# Patient Record
Sex: Female | Born: 2000
Health system: Southern US, Community
[De-identification: ages and names within clinical notes are randomized; demographics above are authoritative.]

## PROBLEM LIST (undated history)

## (undated) DIAGNOSIS — F329 Major depressive disorder, single episode, unspecified: Secondary | ICD-10-CM

## (undated) DIAGNOSIS — R079 Chest pain, unspecified: Secondary | ICD-10-CM

## (undated) DIAGNOSIS — Z91018 Allergy to other foods: Secondary | ICD-10-CM

## (undated) DIAGNOSIS — T7840XA Allergy, unspecified, initial encounter: Secondary | ICD-10-CM

## (undated) DIAGNOSIS — F32A Depression, unspecified: Secondary | ICD-10-CM

## (undated) DIAGNOSIS — F419 Anxiety disorder, unspecified: Secondary | ICD-10-CM

## (undated) DIAGNOSIS — L509 Urticaria, unspecified: Secondary | ICD-10-CM

## (undated) DIAGNOSIS — R609 Edema, unspecified: Secondary | ICD-10-CM

## (undated) DIAGNOSIS — L501 Idiopathic urticaria: Secondary | ICD-10-CM

## (undated) HISTORY — DX: Edema, unspecified: R60.9

## (undated) HISTORY — DX: Depression, unspecified: F32.A

## (undated) HISTORY — DX: Chest pain, unspecified: R07.9

## (undated) HISTORY — DX: Anxiety disorder, unspecified: F41.9

## (undated) HISTORY — PX: TYMPANOSTOMY TUBE PLACEMENT: SHX32

## (undated) HISTORY — DX: Allergy to other foods: Z91.018

## (undated) HISTORY — DX: Idiopathic urticaria: L50.1

## (undated) HISTORY — DX: Major depressive disorder, single episode, unspecified: F32.9

## (undated) HISTORY — PX: ADENOIDECTOMY, TONSILLECTOMY AND MYRINGOTOMY WITH TUBE PLACEMENT: SHX5716

---
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
## 2019-02-14 ENCOUNTER — Ambulatory Visit: Payer: 59 | Attending: Internal Medicine

## 2019-02-14 DIAGNOSIS — Z20822 Contact with and (suspected) exposure to covid-19: Secondary | ICD-10-CM

## 2019-02-16 LAB — NOVEL CORONAVIRUS, NAA: SARS-CoV-2, NAA: NOT DETECTED

## 2019-02-24 ENCOUNTER — Ambulatory Visit: Payer: 59 | Attending: Internal Medicine

## 2019-02-24 DIAGNOSIS — Z20822 Contact with and (suspected) exposure to covid-19: Secondary | ICD-10-CM

## 2019-02-26 LAB — NOVEL CORONAVIRUS, NAA

## 2019-03-17 ENCOUNTER — Other Ambulatory Visit: Payer: 59

## 2019-03-21 ENCOUNTER — Ambulatory Visit: Payer: 59 | Attending: Internal Medicine

## 2019-03-21 DIAGNOSIS — Z20822 Contact with and (suspected) exposure to covid-19: Secondary | ICD-10-CM

## 2019-03-22 LAB — NOVEL CORONAVIRUS, NAA: SARS-CoV-2, NAA: NOT DETECTED

## 2019-05-01 ENCOUNTER — Telehealth: Payer: 59 | Admitting: Family

## 2019-05-01 DIAGNOSIS — J301 Allergic rhinitis due to pollen: Secondary | ICD-10-CM | POA: Diagnosis not present

## 2019-05-01 MED ORDER — LEVOCETIRIZINE DIHYDROCHLORIDE 5 MG PO TABS: 5.0000 mg | ORAL_TABLET | Freq: Every evening | ORAL | 1 refills | Status: DC

## 2019-05-01 MED ORDER — FLUTICASONE PROPIONATE 50 MCG/ACT NA SUSP
2.0000 | Freq: Every day | NASAL | 6 refills | Status: AC
Start: 2019-05-01 — End: ?

## 2019-05-01 NOTE — Progress Notes (Signed)
E visit for Allergic Rhinitis We are sorry that you are not feeling well.  Here is how we plan to help!  Based on what you have shared with me it looks like you have Allergic Rhinitis.  Rhinitis is when a reaction occurs that causes nasal congestion, runny nose, sneezing, and itching.  Most types of rhinitis are caused by an inflammation and are associated with symptoms in the eyes ears or throat. There are several types of rhinitis.  The most common are acute rhinitis, which is usually caused by a viral illness, allergic or seasonal rhinitis, and nonallergic or year-round rhinitis.  Nasal allergies occur certain times of the year.  Allergic rhinitis is caused when allergens in the air trigger the release of histamine in the body.  Histamine causes itching, swelling, and fluid to build up in the fragile linings of the nasal passages, sinuses and eyelids.  An itchy nose and clear discharge are common.  I recommend the following over the counter treatments: You should take a daily dose of antihistamine and Xyzal 5 mg take 1 tablet daily  I also would recommend a nasal spray: Flonase 2 sprays into each nostril once daily   HOME CARE:  You can use an over-the-counter saline nasal spray as needed Avoid areas where there is heavy dust, mites, or molds Stay indoors on windy days during the pollen season Keep windows closed in home, at least in bedroom; use air conditioner. Use high-efficiency house air filter Keep windows closed in car, turn AC on re-circulate Avoid playing out with dog during pollen season  GET HELP RIGHT AWAY IF:  If your symptoms do not improve within 10 days You become short of breath You develop yellow or green discharge from your nose for over 3 days You have coughing fits  MAKE SURE YOU:  Understand these instructions Will watch your condition Will get help right away if you are not doing well or get worse  Thank you for choosing an e-visit. Your e-visit answers  were reviewed by a board certified advanced clinical practitioner to complete your personal care plan. Depending upon the condition, your plan could have included both over the counter or prescription medications. Please review your pharmacy choice. Be sure that the pharmacy you have chosen is open so that you can pick up your prescription now.  If there is a problem you may message your provider in MyChart to have the prescription routed to another pharmacy. Your safety is important to us. If you have drug allergies check your prescription carefully.  For the next 24 hours, you can use MyChart to ask questions about today's visit, request a non-urgent call back, or ask for a work or school excuse from your e-visit provider. You will get an email in the next two days asking about your experience. I hope that your e-visit has been valuable and will speed your recovery.   Approximately 5 minutes was spent documenting and reviewing patient's chart.       

## 2019-05-11 ENCOUNTER — Ambulatory Visit: Payer: 59 | Attending: Internal Medicine

## 2019-05-11 DIAGNOSIS — Z23 Encounter for immunization: Secondary | ICD-10-CM

## 2019-05-11 NOTE — Progress Notes (Signed)
   Covid-19 Vaccination Clinic  Name:  Liana Camerer    MRN: [id number] DOB: March 29, 2000  05/11/2019  Ms. Bardales was observed post Covid-19 immunization for 30 minutes based on pre-vaccination screening without incident. She was provided with Vaccine Information Sheet and instruction to access the V-Safe system.   Ms. Micheletti was instructed to call 911 with any severe reactions post vaccine: Marland Kitchen Difficulty breathing  . Swelling of face and throat  . A fast heartbeat  . A bad rash all over body  . Dizziness and weakness   Immunizations Administered    Name Date Dose VIS Date Route   Pfizer COVID-19 Vaccine 05/11/2019  3:54 PM 0.3 mL 01/20/2019 Intramuscular   Manufacturer: [employer], [employer]   Lot: [id number]   NDC: [address]-2

## 2019-06-06 ENCOUNTER — Ambulatory Visit: Payer: 59 | Attending: Internal Medicine

## 2019-06-06 DIAGNOSIS — Z23 Encounter for immunization: Secondary | ICD-10-CM

## 2019-06-06 NOTE — Progress Notes (Signed)
   Covid-19 Vaccination Clinic  Name:  Maria Pena    MRN: [id number] DOB: [birth year]  06/06/2019  [name] was observed post Covid-19 immunization for 30 minutes based on pre-vaccination screening without incident. She was provided with Vaccine Information Sheet and instruction to access the V-Safe system.   [name] was instructed to call 911 with any severe reactions post vaccine: Marland Kitchen Difficulty breathing  . Swelling of face and throat  . A fast heartbeat  . A bad rash all over body  . Dizziness and weakness   Immunizations Administered    Name Date Dose VIS Date Route   Pfizer COVID-19 Vaccine 06/06/2019  8:15 AM 0.3 mL 04/05/2018 Intramuscular   Manufacturer: [employer], [employer]   Lot: [id number]   NDC: [address]-7

## 2020-05-23 ENCOUNTER — Ambulatory Visit: Payer: 59 | Admitting: Podiatry

## 2020-05-23 ENCOUNTER — Ambulatory Visit (INDEPENDENT_AMBULATORY_CARE_PROVIDER_SITE_OTHER): Payer: 59

## 2020-05-23 ENCOUNTER — Other Ambulatory Visit: Payer: Self-pay

## 2020-05-23 DIAGNOSIS — S93621S Sprain of tarsometatarsal ligament of right foot, sequela: Secondary | ICD-10-CM

## 2020-05-23 DIAGNOSIS — M7750 Other enthesopathy of unspecified foot: Secondary | ICD-10-CM

## 2020-05-23 DIAGNOSIS — M7751 Other enthesopathy of right foot: Secondary | ICD-10-CM

## 2020-05-23 DIAGNOSIS — M2041 Other hammer toe(s) (acquired), right foot: Secondary | ICD-10-CM | POA: Diagnosis not present

## 2020-05-23 DIAGNOSIS — L84 Corns and callosities: Secondary | ICD-10-CM

## 2020-05-23 DIAGNOSIS — M2042 Other hammer toe(s) (acquired), left foot: Secondary | ICD-10-CM

## 2020-05-23 DIAGNOSIS — M7671 Peroneal tendinitis, right leg: Secondary | ICD-10-CM

## 2020-05-23 NOTE — Patient Instructions (Signed)
Peroneal Tendinopathy Rehab Ask your health care provider which exercises are safe for you. Do exercises exactly as told by your health care provider and adjust them as directed. It is normal to feel mild stretching, pulling, tightness, or discomfort as you do these exercises. Stop right away if you feel sudden pain or your pain gets worse. Do not begin these exercises until told by your health care provider. Stretching and range-of-motion exercises These exercises warm up your muscles and joints and improve the movement and flexibility of your ankle. These exercises also help to relieve pain and stiffness. Gastroc and soleus stretch, standing  This is an exercise in which you stand on a step and use your body weight to stretch your calf muscles. To do this exercise: 1. Stand on the edge of a step on the ball of your left / right foot. The ball of your foot is on the walking surface, right under your toes. 2. Keep your other foot firmly on the same step. 3. Hold on to the wall, a railing, or a chair for balance. 4. Slowly lift your other foot, allowing your body weight to press your left / right heel down over the edge of the step. You should feel a stretch in your left / right calf (gastrocnemius and soleus). 5. Hold this position for 15 seconds. 6. Return both feet to the step. 7. Repeat this exercise with a slight bend in your left / right knee. Repeat 5 times with your left / right knee straight and 5 times with your left / right knee bent. Complete this exercise 2 times a day. Strengthening exercises These exercises build strength and endurance in your foot and ankle. Endurance is the ability to use your muscles for a long time, even after they get tired. Ankle dorsiflexion with band   1. Secure a rubber exercise band or tube to an object, such as a table leg, that will not move when the band is pulled. 2. Secure the other end of the band around your left / right foot. 3. Sit on the floor,  facing the object with your left / right leg extended. The band or tube should be slightly tense when your foot is relaxed. 4. Slowly flex your left / right ankle and toes to bring your foot toward you (dorsiflexion). 5. Hold this position for 15 seconds. 6. Let the band or tube slowly pull your foot back to the starting position. Repeat 5 times. Complete this exercise 2 times a day. Ankle eversion 1. Sit on the floor with your legs straight out in front of you. 2. Loop a rubber exercise band or tube around the ball of your left / right foot. The ball of your foot is on the walking surface, right under your toes. 3. Hold the ends of the band in your hands, or secure the band to a stable object. The band or tube should be slightly tense when your foot is relaxed. 4. Slowly push your foot outward, away from your other leg (eversion). 5. Hold this position for 15 seconds. 6. Slowly return your foot to the starting position. Repeat 5 times. Complete this exercise 2 times a day. Plantar flexion, standing  This exercise is sometimes called standing heel raise. 1. Stand with your feet shoulder-width apart. 2. Place your hands on a wall or table to steady yourself as needed, but try not to use it for support. 3. Keep your weight spread evenly over the width of your feet   while you slowly rise up on your toes (plantar flexion). If told by your health care provider: ? Shift your weight toward your left / right leg until you feel challenged. ? Stand on your left / right leg only. 4. Hold this position for 15 seconds. Repeat 2 times. Complete this exercise 2 times a day. Single leg stand 1. Without shoes, stand near a railing or in a doorway. You may hold on to the railing or door frame as needed. 2. Stand on your left / right foot. Keep your big toe down on the floor and try to keep your arch lifted. ? Do not roll to the outside of your foot. ? If this exercise is too easy, you can try it with your  eyes closed or while standing on a pillow. 3. Hold this position for 15 seconds. Repeat 5 times. Complete this exercise 2 times a day. This information is not intended to replace advice given to you by your health care provider. Make sure you discuss any questions you have with your health care provider. Document Revised: 05/17/2018 Document Reviewed: 05/17/2018 Elsevier Patient Education  2020 [employer]  

## 2020-05-28 ENCOUNTER — Encounter: Payer: Self-pay | Admitting: Podiatry

## 2020-05-28 NOTE — Progress Notes (Signed)
  Subjective:  Patient ID: Maria Pena, female    DOB: [birth year],  MRN: [id number]  Chief Complaint  Patient presents with  . Callouses      (np) preulcerative corn/callus bilateral    20 y.o. female presents with the above complaint. History confirmed with patient.  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  She has these on both fourth toes on the outside rubbing on the fifth toe.  She wears vans shoes a lot and seems to make it worse.  She has lateral right foot pain as well she has a history of fracture in 2020 and twisted that ankle last October.  Objective:  Physical Exam: warm, good capillary refill, no trophic changes or ulcerative lesions, normal DP and PT pulses and normal sensory exam.  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  Mild right foot pain along the peroneus brevis at its insertion without gross instability or ecchymosis or evidence of fracture.  She also has mild pain along the tarsometatarsal joints with stress abduction and palpation the Lisfranc interval  Radiographs: X-ray of the right foot: no fracture, dislocation, swelling or degenerative changes noted Assessment:   1. Peroneal tendinitis of right lower extremity   2. Callus of foot   3. Hammertoe of right foot   4. Hammertoe of left foot   5. Sprain of ligament of tarsometatarsal joint of right foot, sequela      Plan:  Patient was evaluated and treated and all questions answered.  Reviewed etiology and treatment options for heloma molle and the reason that this forms with the adductovarus rotation of the toes.  Recommended shaving the lesions down today and offloading with silicone pads.  If not helpful could consider arthroplasty to alleviate this, she will consider this for summer break.  Also discussed with her that she likely has some peroneal tendinitis from her previous right foot and ankle injury.  There is no gross  instability or evidence of new fracture today on her x-rays or clinically.  She did have some pain in the tarsometatarsal joint as well.  If not improving may want to consider MRI to evaluate for subtle Lisfranc injury.  Gave her stretching and icing exercises for peroneal tendinopathy.  OTC NSAIDs as needed, reviewed RICE protocol.  Return in about 6 weeks (around 07/04/2020) for re-check peroneal tendinitis.

## 2020-07-09 ENCOUNTER — Ambulatory Visit: Payer: 59 | Admitting: Podiatry

## 2020-07-09 ENCOUNTER — Encounter: Payer: Self-pay | Admitting: Podiatry

## 2020-07-09 ENCOUNTER — Other Ambulatory Visit: Payer: Self-pay

## 2020-07-09 DIAGNOSIS — S93621S Sprain of tarsometatarsal ligament of right foot, sequela: Secondary | ICD-10-CM

## 2020-07-09 DIAGNOSIS — M7671 Peroneal tendinitis, right leg: Secondary | ICD-10-CM

## 2020-07-09 NOTE — Progress Notes (Signed)
  Subjective:  Patient ID: Maria Pena, female    DOB: February 11, 2000,  MRN: [id number]  Chief Complaint  Patient presents with  . Foot Pain    Patient reports right foot is about the same as it was last visit. Some days she reports extreme pain and other days no discomfort at all. Patient stated that she tried the stretching and icing as recommended but it has not changed the discomfort in the right foot.   Melene Plan    Patient stated that she has been wearing open-toe shoes and she has not had any corn/callous develop. When wearing closed toe shoes she wears a corn pad for extra support.     20 y.o. female returns for follow-up with the above complaint. History confirmed with patient.  Midfoot and ankle pain is still persistent.  She has had improvement in the calluses that do not bother her anymore the toes  Objective:  Physical Exam: warm, good capillary refill, no trophic changes or ulcerative lesions, normal DP and PT pulses and normal sensory exam. Mild right foot pain along the peroneus brevis at its insertion without gross instability or ecchymosis or evidence of fracture.  She also has mild pain along the tarsometatarsal joints with stress abduction and palpation the Lisfranc interval  Radiographs: X-ray of the right foot: no fracture, dislocation, swelling or degenerative changes noted Assessment:   1. Peroneal tendinitis of right lower extremity   2. Sprain of ligament of tarsometatarsal joint of right foot, sequela      Plan:  Patient was evaluated and treated and all questions answered.  Reviewed further treatment of the peroneal tendinitis as well as the previous likely Lisfranc injury.  This point she has not had improvement with nonsurgical treatment.  Recommend we evaluate with an MRI to discuss possible surgical treatment or further treatment nonsurgically.  MRI of the ankle for the peroneal tendons as well as the MRI of the foot for the Lisfranc complex was ordered.   Return in 4 weeks after completion for follow-up  Return in about 4 weeks (around 08/06/2020) for after MRI to review.

## 2020-07-24 ENCOUNTER — Ambulatory Visit: Payer: 59 | Admitting: Behavioral Health

## 2020-07-30 ENCOUNTER — Ambulatory Visit
Admission: RE | Admit: 2020-07-30 | Discharge: 2020-07-30 | Disposition: A | Discharge status: Home / Self Care | Payer: 59 | Source: Ambulatory Visit | Attending: Podiatry | Admitting: Podiatry

## 2020-07-30 ENCOUNTER — Other Ambulatory Visit: Payer: Self-pay

## 2020-07-30 DIAGNOSIS — S93621S Sprain of tarsometatarsal ligament of right foot, sequela: Secondary | ICD-10-CM

## 2020-07-30 DIAGNOSIS — M7671 Peroneal tendinitis, right leg: Secondary | ICD-10-CM

## 2020-07-30 IMAGING — MR MR ANKLE*R* W/O CM
5 series · 38 of 40 positions shown · non-contrast
Comparison: Right foot radiograph [DATE]

CLINICAL DATA: Ankle pain, tendon abnormality suspected, neg xray

EXAM:
MRI OF THE RIGHT ANKLE WITHOUT CONTRAST
TECHNIQUE: Multiplanar, multisequence MR imaging of the ankle was performed. No
intravenous contrast was administered.

[Series 4: T2 fat-sat · axial · 3.0mm · 0.50mm/px · z∈[-106,+15]mm · 9 of 32 slices shown (1 of 2)]
[im 1/32]
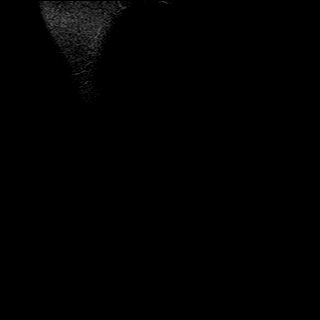
[im 4/32]
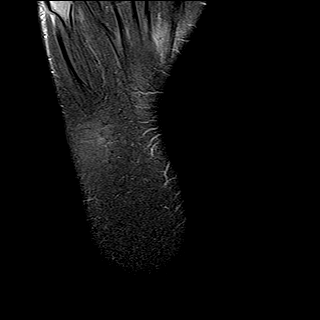
[im 8/32]
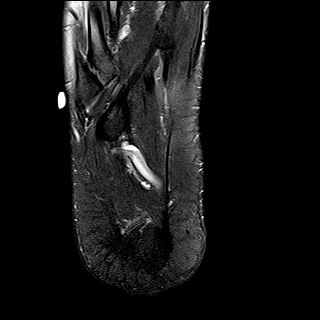
[im 12/32]
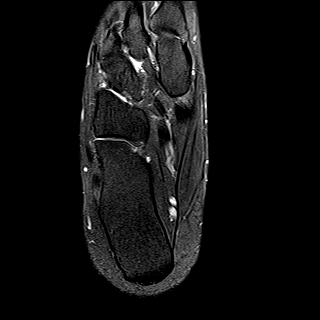
[im 16/32]
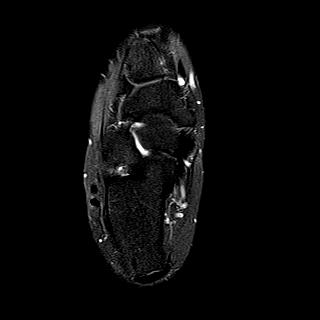
[im 20/32]
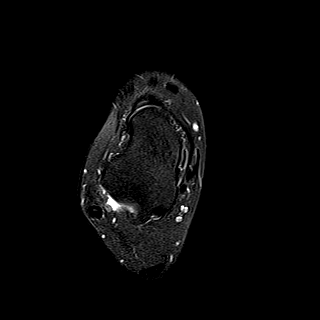
[im 24/32]
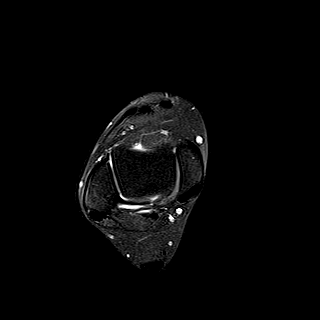
[im 28/32]
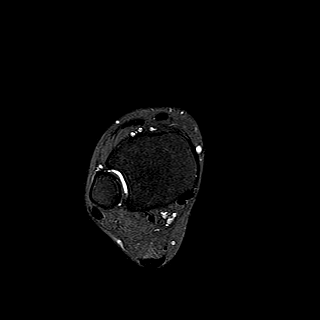
[im 32/32]
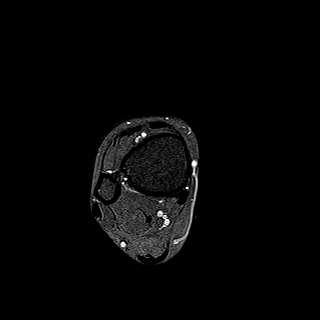

[Series 5: PD fat-sat · axial · 3.0mm · 0.50mm/px · z∈[-106,+15]mm · 9 of 32 slices shown]
[im 1/32]
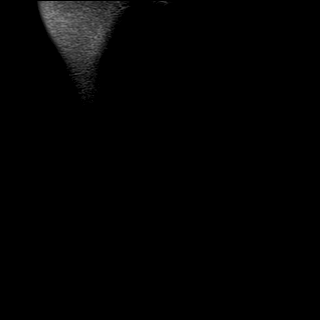
[im 4/32]
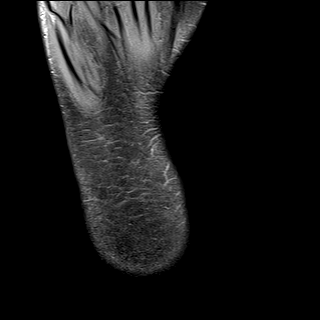
[im 8/32]
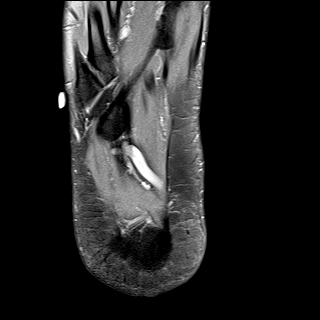
[im 12/32]
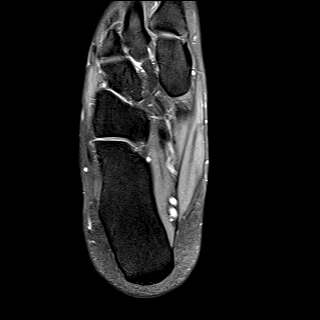
[im 16/32]
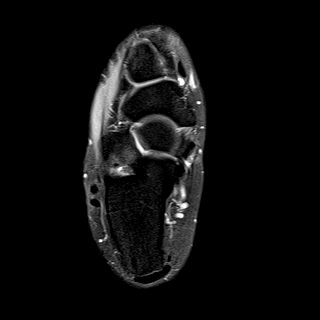
[im 20/32]
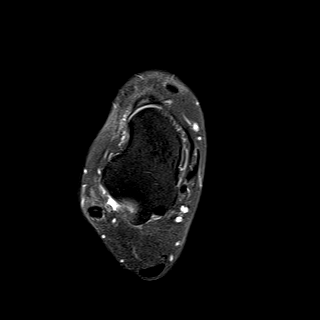
[im 24/32]
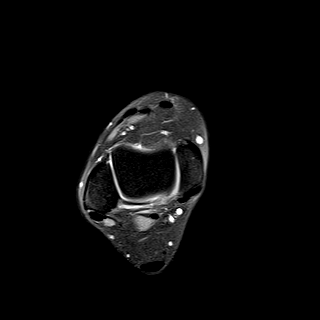
[im 28/32]
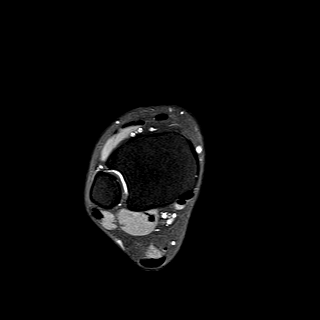
[im 32/32]
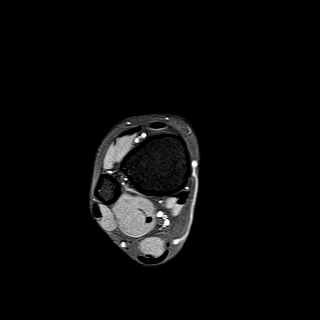

[Series 6: T1 · sagittal · 4.0mm · 0.56mm/px · 6 of 22 slices shown]
[im 1/22]
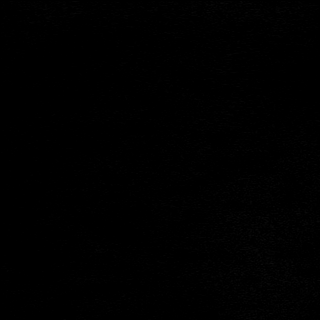
[im 5/22]
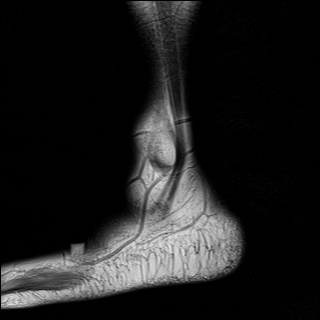
[im 9/22]
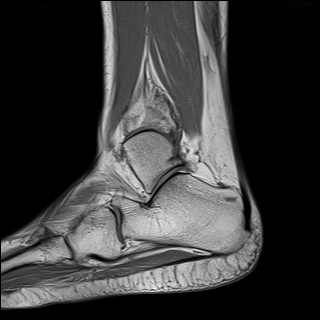
[im 13/22]
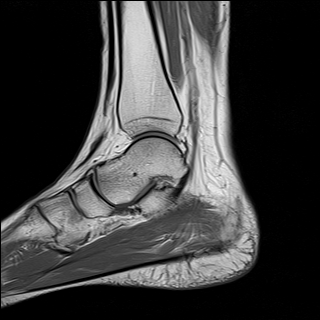
[im 17/22]
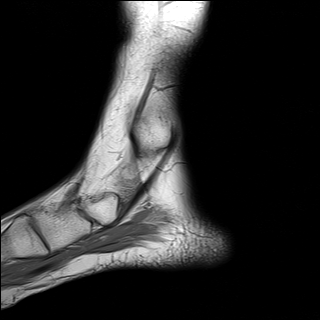
[im 22/22]
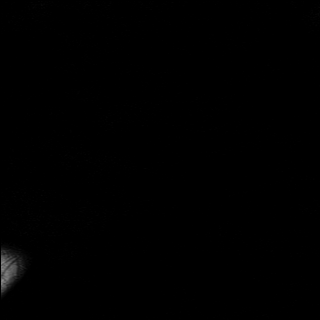

[Series 7: STIR · sagittal · 4.0mm · 0.35mm/px · 6 of 22 slices shown]
[im 1/22]
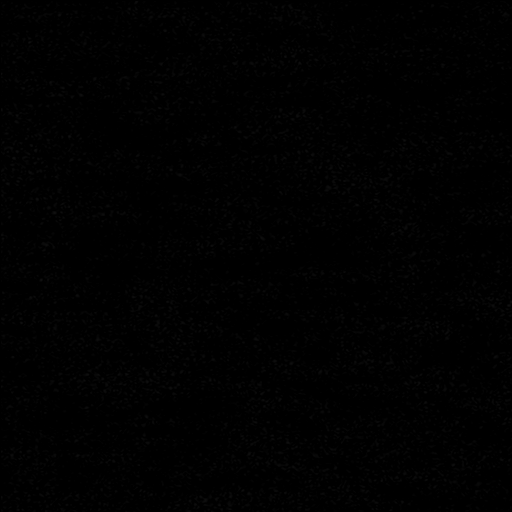
[im 5/22]
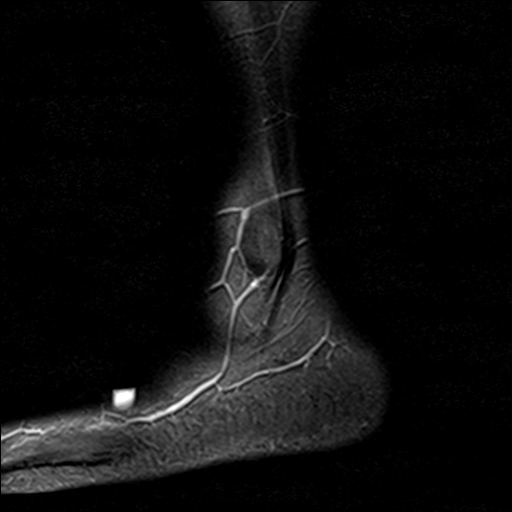
[im 9/22]
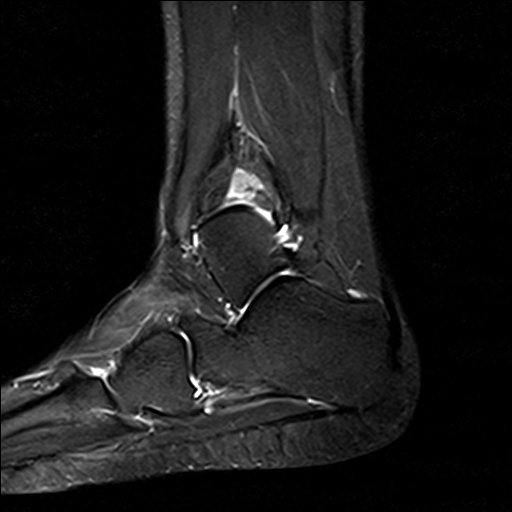
[im 13/22]
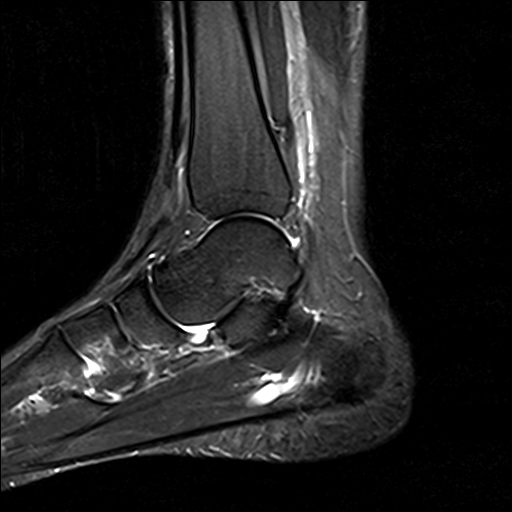
[im 17/22]
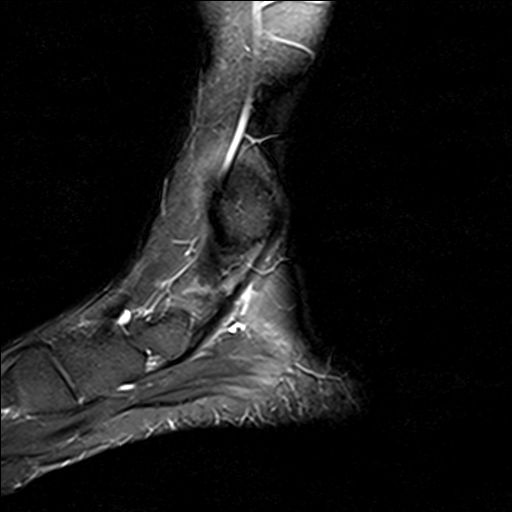
[im 22/22]
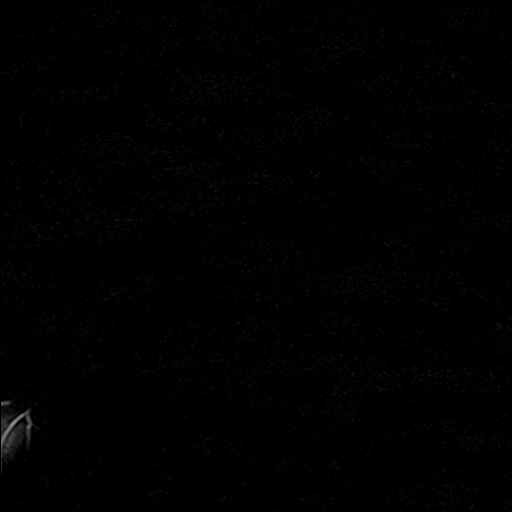

[Series 8: T2 fat-sat · coronal · 3.0mm · 0.50mm/px · 8 of 38 slices shown (2 of 2)]
[im 1/38]
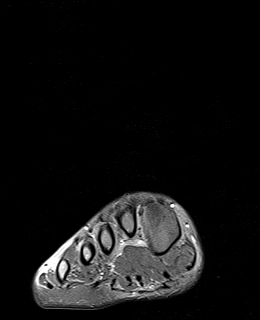
[im 5/38]
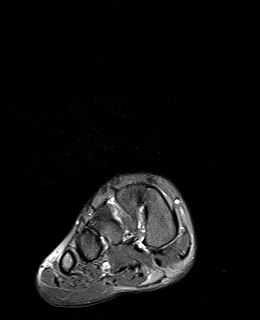
[im 13/38]
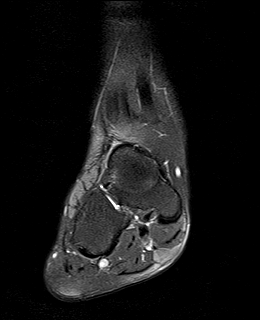
[im 17/38]
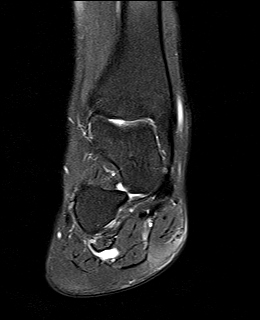
[im 21/38]
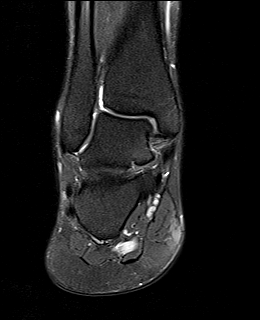
[im 25/38]
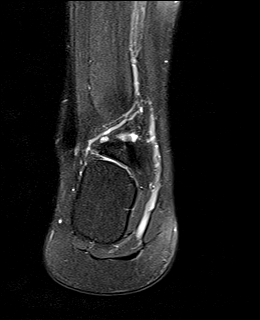
[im 33/38]
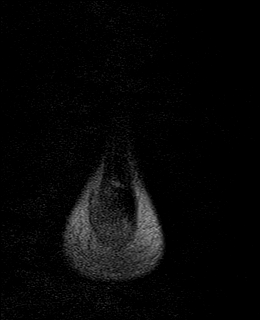
[im 38/38]
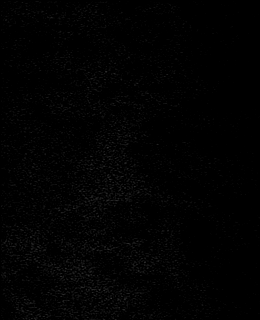

[38 of 40 positions shown; findings below may reference images not displayed]

FINDINGS: TENDONS

Peroneal: Peroneal longus tendon intact. Peroneal brevis intact.

Posteromedial: Posterior tibial tendon intact. Flexor digitorum
longus tendon intact. Flexor hallucis longus tendon intact.

Anterior: Tibialis anterior tendon intact. Extensor hallucis longus
tendon intact Extensor digitorum longus tendon intact.

Achilles:  Intact.

Plantar Fascia: Intact.

LIGAMENTS

Lateral: Anterior talofibular ligament intact. Calcaneofibular
ligament intact. Posterior talofibular ligament intact. Anterior and
posterior tibiofibular ligaments intact.

Medial: Deltoid ligament intact. Spring ligament intact.

CARTILAGE

Ankle Joint: No joint effusion. Normal ankle mortise. No chondral
defect.

Subtalar Joints/Sinus Tarsi: Normal subtalar joints. No subtalar
joint effusion. Normal sinus tarsi.

Bones: No marrow signal abnormality.  No fracture or dislocation.

Soft Tissue: No fluid collection or hematoma. Muscles are normal
without edema or atrophy. Tarsal tunnel is normal.
IMPRESSION: Unremarkable right ankle MRI. No acute findings to explain the
patient's right ankle pain.

## 2020-07-30 IMAGING — MR MR FOOT*R* W/O CM
4 of 5 series · 19 of 40 positions shown · non-contrast
Comparison: Right foot radiograph [DATE]

CLINICAL DATA: Foot trauma, Lisfranc suspected, xray done (Age >=
6y)

EXAM:
MRI OF THE RIGHT FOREFOOT WITHOUT CONTRAST
TECHNIQUE: Multiplanar, multivalence MR imaging of the right foot was
performed. No intravenous contrast was administered.

[Series 3: T1 · coronal · 3.0mm · 0.19mm/px · 3 of 44 slices shown (1 of 2)]
[im 8/44]
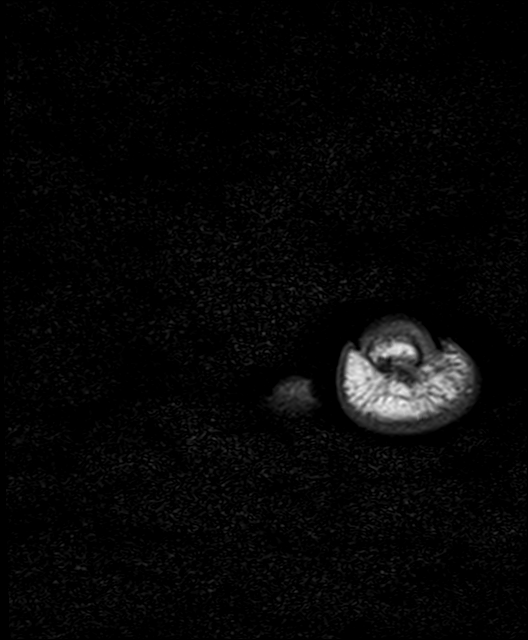
[im 24/44]
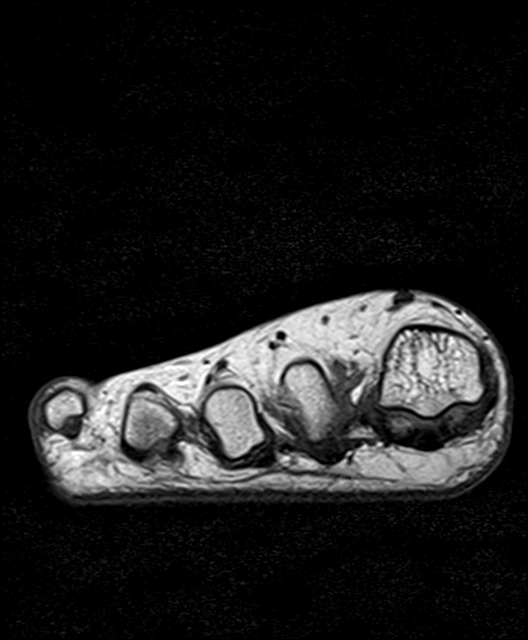
[im 40/44]
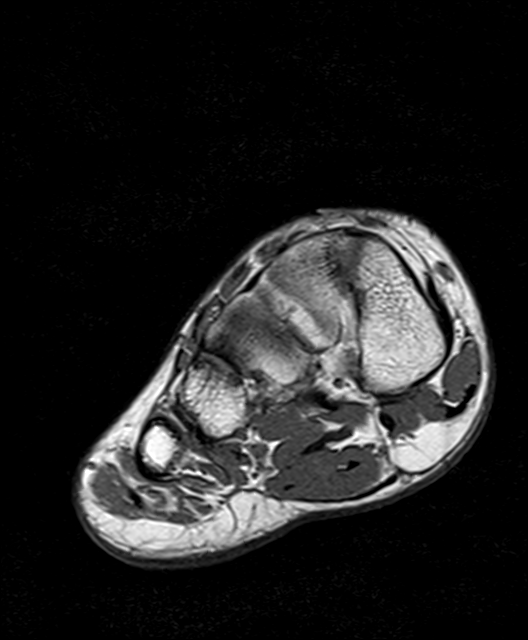

[Series 5: T2 fat-sat · axial · 3.0mm · 0.35mm/px · z∈[-158,-78]mm · 5 of 22 slices shown (1 of 2)]
[im 1/22]
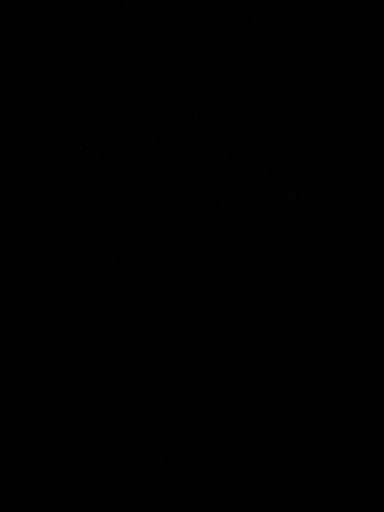
[im 6/22]
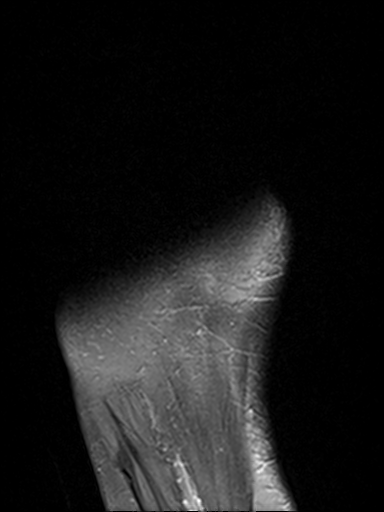
[im 11/22]
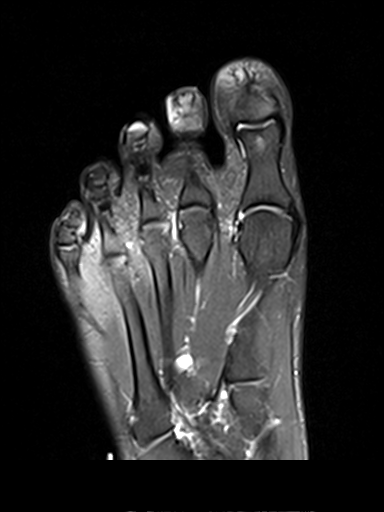
[im 16/22]
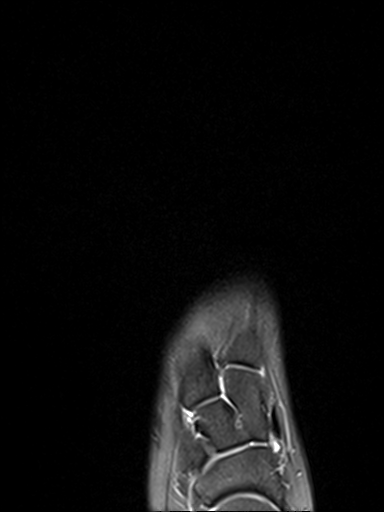
[im 22/22]
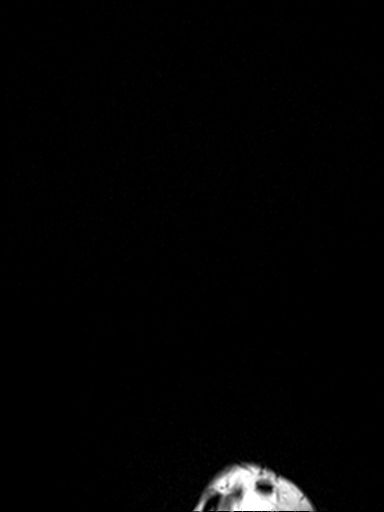

[Series 6: T1 · axial · 3.0mm · 0.35mm/px · z∈[-158,-78]mm · 3 of 22 slices shown (2 of 2)]
[im 1/22]
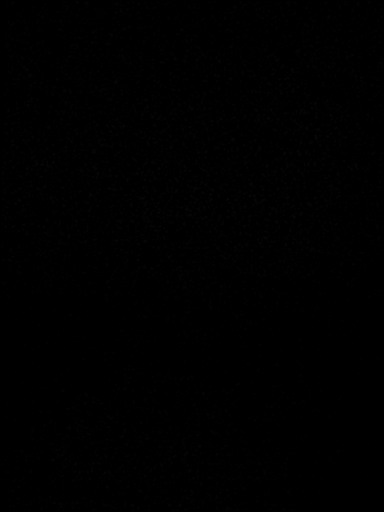
[im 11/22]
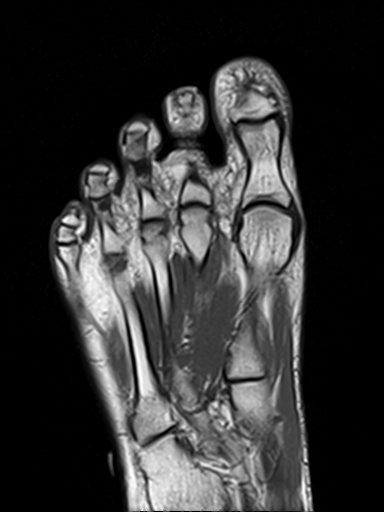
[im 22/22]
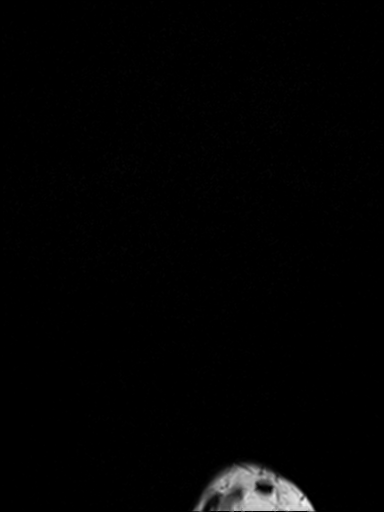

[Series 8: T2 fat-sat · coronal · 3.0mm · 0.19mm/px · 8 of 44 slices shown (2 of 2)]
[im 1/44]
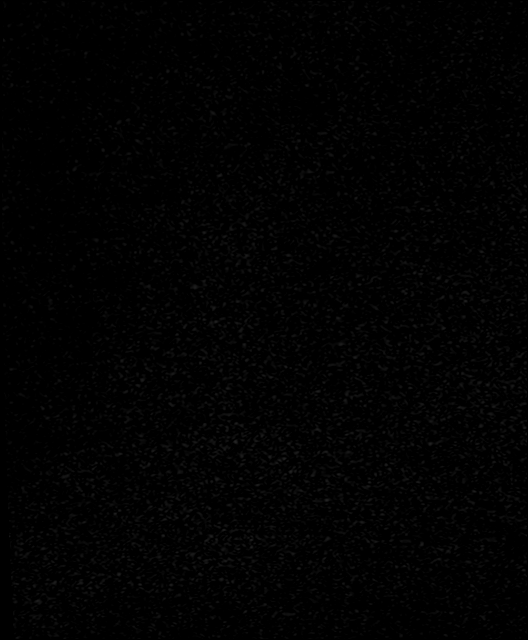
[im 5/44]
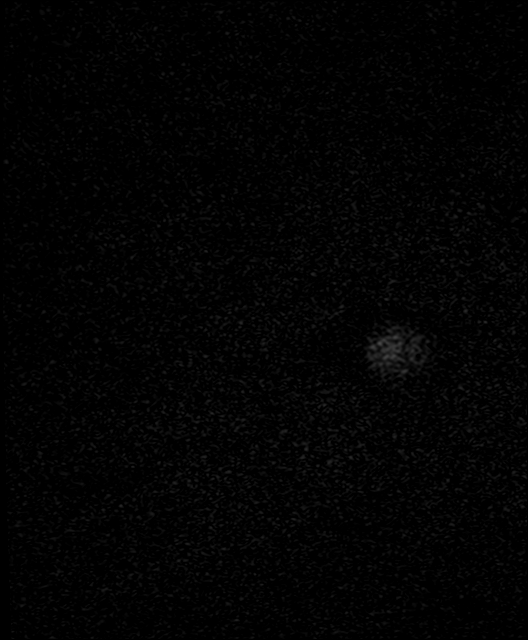
[im 9/44]
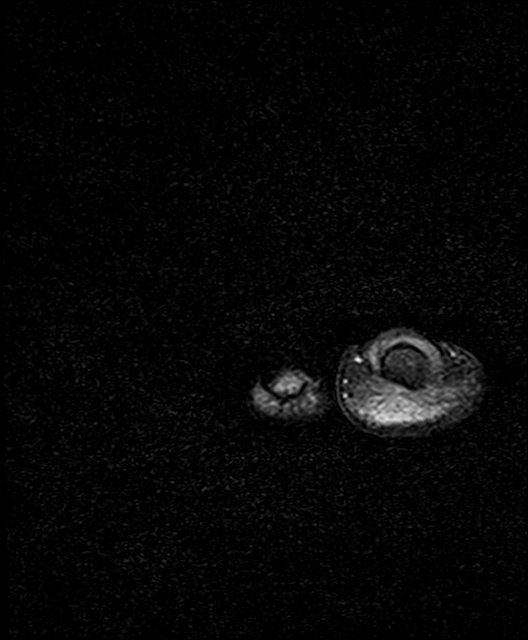
[im 13/44]
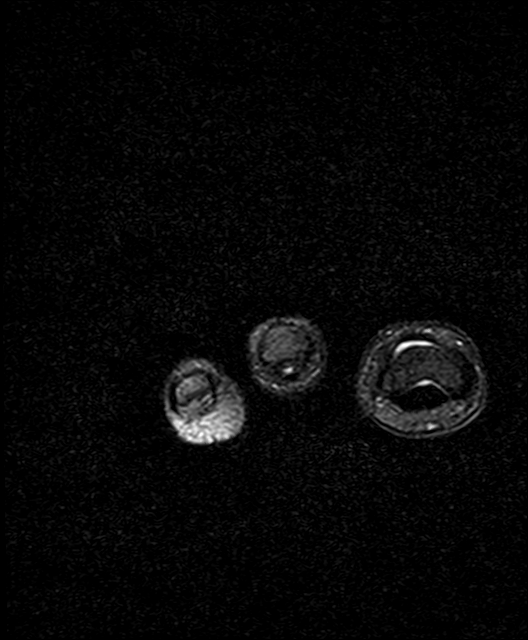
[im 18/44]
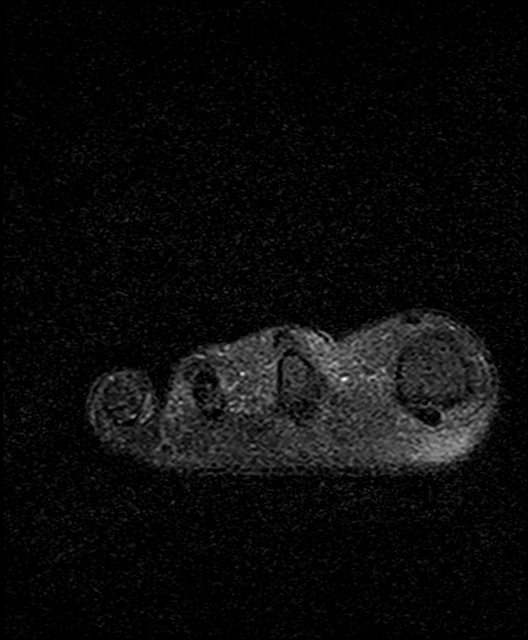
[im 22/44]
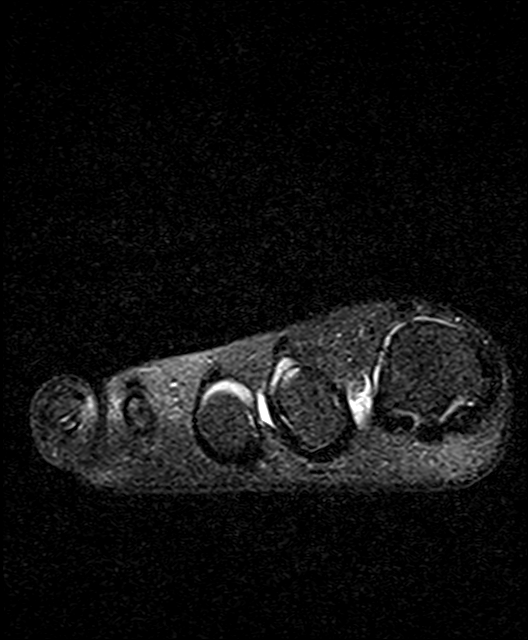
[im 26/44]
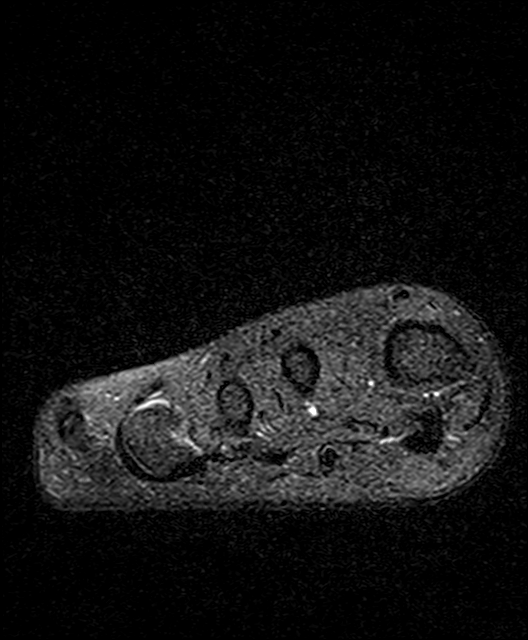
[im 39/44]
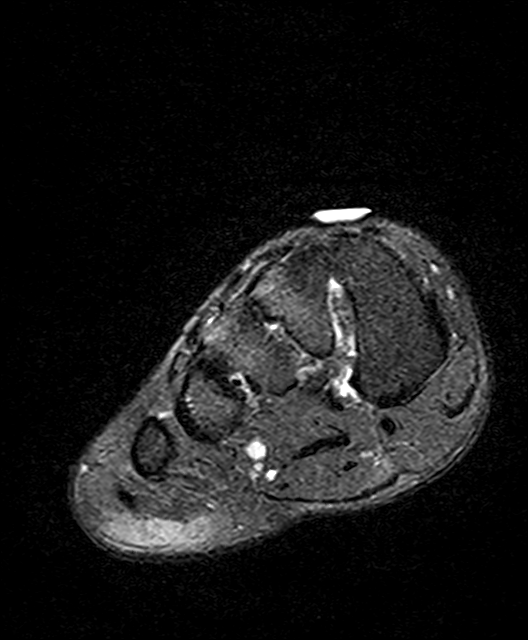

[19 of 40 positions shown; findings below may reference images not displayed]

FINDINGS: Bones/Joint/Cartilage

There is no evidence of fracture. The cortex is intact. There is no
significant marrow signal alteration.

Ligaments

Intact Lisfranc ligament.  No evidence of plantar plate tear

Muscles and Tendons

No significant muscle atrophy. No acute tendon tear in the forefoot.

Soft tissues

No evidence of soft tissue mass. There is no evidence of
intermetatarsal neuroma or bursitis.
IMPRESSION: Unremarkable right foot MRI. Intact Lisfranc ligament. No acute
findings to explain the patient's foot pain.

## 2020-08-05 ENCOUNTER — Other Ambulatory Visit: Payer: Self-pay

## 2020-08-05 ENCOUNTER — Ambulatory Visit: Payer: 59 | Admitting: Behavioral Health

## 2020-08-05 ENCOUNTER — Encounter: Payer: Self-pay | Admitting: Behavioral Health

## 2020-08-05 VITALS — BP 119/77 | HR 81 | Ht 65.0 in | Wt 221.0 lb

## 2020-08-05 DIAGNOSIS — F411 Generalized anxiety disorder: Secondary | ICD-10-CM

## 2020-08-05 DIAGNOSIS — F900 Attention-deficit hyperactivity disorder, predominantly inattentive type: Secondary | ICD-10-CM | POA: Diagnosis not present

## 2020-08-05 DIAGNOSIS — F3341 Major depressive disorder, recurrent, in partial remission: Secondary | ICD-10-CM | POA: Diagnosis not present

## 2020-08-05 MED ORDER — AMPHETAMINE-DEXTROAMPHET ER 10 MG PO CP24: 10.0000 mg | ORAL_CAPSULE | Freq: Every day | ORAL | 0 refills | Status: DC

## 2020-08-05 NOTE — Progress Notes (Signed)
[facility] MD/PA/NP Initial Note  08/05/2020 12:09 PM Maria Pena  MRN:  [id number]  Chief Complaint:  Chief Complaint   ADHD; Anxiety; Depression; Establish Care; Patient Education     HPI:  20 year old patient presents to this office for initial visit and to establish care. She says that she is here today because of her issues with concentration and attention problems that effect her productivity in school. She says that she is currently a Consulting civil engineer at [employer] and has always had problems with focus and attention such as reading assignments or books. Says she gets easily distracted before being able to complete one task before moving to another. Says this is a great source of her anxiety. She has struggled with depression and anxiety for several years but says her father did not think she should be evaluated for ADHD. She said she finally got to the place as an adult that she wanted to finally seek treatment. She reports increased irritability during the school year, racing thoughts, and distractibility. Reports depression a 3/10 today and anxiety at 6/10. Sleeps 7-8 hours per night. No mania, no psychosis. No SI/HI.  No prior psychiatric medication trials   Visit Diagnosis:    ICD-10-CM   1. Attention deficit hyperactivity disorder (ADHD), predominantly inattentive type  F90.0 amphetamine-dextroamphetamine (ADDERALL XR) 10 MG 24 hr capsule    2. Generalized anxiety disorder  F41.1 amphetamine-dextroamphetamine (ADDERALL XR) 10 MG 24 hr capsule    3. Recurrent major depressive disorder, in partial remission (HCC)  F33.41 amphetamine-dextroamphetamine (ADDERALL XR) 10 MG 24 hr capsule      Past Psychiatric History: Aliene Beams PCP  Past Medical History:  Past Medical History:  Diagnosis Date   Allergic    Anxiety    Chest pain    Chronic idiopathic urticaria    Depression    Edema    feet and legs   Hives    unknown origin   Multiple food allergies     soy, soy sauce, shrimp    Past Surgical History:  Procedure Laterality Date   ADENOIDECTOMY, TONSILLECTOMY AND MYRINGOTOMY WITH TUBE PLACEMENT     TYMPANOSTOMY TUBE PLACEMENT      Family Psychiatric History: none  Family History:  Family History  Problem Relation Age of Onset   Cancer Mother    Depression Mother    Anxiety disorder Mother    Sleep apnea Mother    Eating disorder Mother    Obesity Mother    Sleep apnea Father    Obesity Father     Social History:  Social History   Socioeconomic History   Marital status: Single    Spouse name: Not on file   Number of children: Not on file   Years of education: Not on file   Highest education level: Not on file  Occupational History   Occupation: student  Tobacco Use   Smoking status: Never   Smokeless tobacco: Never  Substance and Sexual Activity   Alcohol use: No   Drug use: No   Sexual activity: Not on file  Other Topics Concern   Not on file  Social History Narrative   Not on file   Social Determinants of Health   Financial Resource Strain: Not on file  Food Insecurity: Not on file  Transportation Needs: Not on file  Physical Activity: Not on file  Stress: Not on file  Social Connections: Not on file    Allergies:  Allergies  Allergen Reactions  Colchicine Other (See Comments)    Hands becomes numb from the elbows down.   Nickel    Other     Topical steroids, tea tree oil   Soy Allergy     Metabolic Disorder Labs: Lab Results  Component Value Date   HGBA1C 5.0 08/06/2016   No results found for: PROLACTIN Lab Results  Component Value Date   CHOL 135 08/06/2016   TRIG 104 (H) 08/06/2016   HDL 45 08/06/2016   LDLCALC 69 08/06/2016   LDLCALC 92 04/01/2016   Lab Results  Component Value Date   TSH 2.120 04/01/2016    Therapeutic Level Labs: No results found for: LITHIUM No results found for: VALPROATE No components found for:  CBMZ  Current Medications: Current Outpatient  Medications  Medication Sig Dispense Refill   amphetamine-dextroamphetamine (ADDERALL XR) 10 MG 24 hr capsule Take 1 capsule (10 mg total) by mouth daily. 30 capsule 0   Biotin w/ Vitamins C & E (HAIR/SKIN/NAILS PO) Take by mouth.     Calcium Carb-Cholecalciferol (CALCIUM/VITAMIN D PO) Take by mouth every morning.     citalopram (CELEXA) 10 MG tablet Take 10 mg by mouth daily.     diphenhydrAMINE (BENADRYL) 25 MG tablet Take 1 tablet (25 mg total) by mouth every 6 (six) hours. 20 tablet 0   EPINEPHrine 0.3 mg/0.3 mL IJ SOAJ injection Inject into the muscle.     fluticasone (FLONASE) 50 MCG/ACT nasal spray Place 2 sprays into both nostrils daily. 16 g 6   ibuprofen (ADVIL,MOTRIN) 600 MG tablet Take 1 tablet (600 mg total) by mouth every 6 (six) hours as needed. 30 tablet 0   Multiple Vitamin (MULTIVITAMIN WITH MINERALS) TABS tablet Take 1 tablet by mouth daily.     Norethindrone-Ethinyl Estradiol Biphasic (NECON 10/11) 0.5-35/1-35 MG-MCG tablet Take 1 tablet by mouth daily.     cetirizine (ZYRTEC) 10 MG tablet Take 10 mg by mouth daily. (Patient not taking: Reported on 08/05/2020)     cycloSPORINE (SANDIMMUNE) 100 MG capsule Take 100 mg by mouth 2 (two) times daily. (Patient not taking: Reported on 08/05/2020)     escitalopram (LEXAPRO) 20 MG tablet Take 20 mg by mouth at bedtime.     famotidine (PEPCID) 20 MG tablet Take 1 tablet (20 mg total) by mouth 2 (two) times daily. (Patient not taking: Reported on 08/05/2020) 30 tablet 0   hydroxychloroquine (PLAQUENIL) 200 MG tablet Take 200 mg by mouth daily. (Patient not taking: Reported on 08/05/2020)     hydrOXYzine (ATARAX/VISTARIL) 10 MG tablet Take 10 mg by mouth 3 (three) times daily as needed. (Patient not taking: Reported on 08/05/2020)     levocetirizine (XYZAL) 5 MG tablet Take 1 tablet (5 mg total) by mouth every evening. (Patient not taking: Reported on 08/05/2020) 90 tablet 1   NATAZIA 3/2-2/2-3/1 MG tablet Take 1 tablet by mouth daily.      ondansetron (ZOFRAN) 4 MG tablet Take 1 tablet (4 mg total) by mouth every 8 (eight) hours as needed for nausea or vomiting. (Patient not taking: Reported on 08/05/2020) 20 tablet 0   Ranitidine HCl (ZANTAC PO) Take 1 tablet by mouth every morning. (Patient not taking: Reported on 08/05/2020)     Vitamin D, Ergocalciferol, (DRISDOL) 50000 units CAPS capsule Take 1 capsule (50,000 Units total) by mouth every 7 (seven) days. (Patient not taking: Reported on 08/05/2020) 4 capsule 0   No current facility-administered medications for this visit.    Medication Side Effects: none  Orders placed  this visit:  No orders of the defined types were placed in this encounter.   Psychiatric Specialty Exam:  Review of Systems  Constitutional: Negative.   Cardiovascular:  Negative for palpitations.  Allergic/Immunologic: Negative.   Neurological:  Negative for tremors and weakness.  Psychiatric/Behavioral:  Positive for decreased concentration. The patient is nervous/anxious.    Blood pressure 119/77, pulse 81, height 5\' 5"  (1.651 m), weight 221 lb (100.2 kg).Body mass index is 36.78 kg/m.  General Appearance: Casual and Neat  Eye Contact:  Good  Speech:  Clear and Coherent  Volume:  Normal  Mood:  Anxious  Affect:  Appropriate  Thought Process:  Coherent  Orientation:  Full (Time, Place, and Person)  Thought Content: Logical   Suicidal Thoughts:  No  Homicidal Thoughts:  No  Memory:  WNL  Judgement:  Good  Insight:  Good  Psychomotor Activity:  Normal  Concentration:  Concentration: Good  Recall:  Good  Fund of Knowledge: Good  Language: Good  Assets:  Desire for Improvement  ADL's:  Intact  Cognition: WNL  Prognosis:  Good   Screenings:  PHQ2-9    Flowsheet Row Office Visit from 08/05/2020 in [facility] Office Visit from 04/01/2016 in [facility]  PHQ-2 Total Score 0 3  PHQ-9 Total Score -- 14       Receiving Psychotherapy: No   Treatment  Plan/Recommendations:   Start Adderall 10 mg daily. Will report side effects or worsening symptoms promptly. Will follow up in 4 weeks to reevaluate Greater than 50% of 60 min face to face time with patient was spent on counseling and coordination of care.  We discussed patients history of anxiety and depression and current stressors and triggers. Reviewed patients history of attention problems stemming back to middle school. We discussed potential benefits, risks, and side effects of stimulants with patient to include increased heart rate, palpitations, insomnia, increased anxiety, increased irritability, or decreased appetite.  Instructed patient to contact office if experiencing any significant tolerability issues. Patient was referred to psych testing but understands there could be 2 month wait for appointment. She understands that she will have to follow up every three months on regular basis to receive stimulant medication. PDMP was reviewed and presented no know risk factors.     [facility], NP

## 2020-08-06 ENCOUNTER — Ambulatory Visit: Payer: 59 | Admitting: Podiatry

## 2020-08-06 DIAGNOSIS — S92301S Fracture of unspecified metatarsal bone(s), right foot, sequela: Secondary | ICD-10-CM

## 2020-08-06 DIAGNOSIS — M7671 Peroneal tendinitis, right leg: Secondary | ICD-10-CM | POA: Diagnosis not present

## 2020-08-07 ENCOUNTER — Encounter: Payer: Self-pay | Admitting: Podiatry

## 2020-08-07 NOTE — Progress Notes (Signed)
Subjective:  Patient ID: Maria Pena, female    DOB: 2000-02-22,  MRN: [id number]  Chief Complaint  Patient presents with   Tendonitis    4 week follow up right, MRI results    20 y.o. female returns for follow-up with the above complaint. History confirmed with patient.  Doing little bit better she completed the MRI  Objective:  Physical Exam: warm, good capillary refill, no trophic changes or ulcerative lesions, normal DP and PT pulses and normal sensory exam. Mild right foot pain along the peroneus brevis at its insertion without gross instability or ecchymosis or evidence of fracture.  She also has pain in the first interspace, this is worse with resisted eversion  Radiographs: X-ray of the right foot: no fracture, dislocation, swelling or degenerative changes noted  Study Result  Narrative & Impression  CLINICAL DATA:  Foot trauma, Lisfranc suspected, xray done (Age >= 6y)   EXAM: MRI OF THE RIGHT FOREFOOT WITHOUT CONTRAST   TECHNIQUE: Multiplanar, multivalence MR imaging of the right foot was performed. No intravenous contrast was administered.   COMPARISON:  Right foot radiograph 05/23/2020   FINDINGS: Bones/Joint/Cartilage   There is no evidence of fracture. The cortex is intact. There is no significant marrow signal alteration.   Ligaments   Intact Lisfranc ligament.  No evidence of plantar plate tear   Muscles and Tendons   No significant muscle atrophy. No acute tendon tear in the forefoot.   Soft tissues   No evidence of soft tissue mass. There is no evidence of intermetatarsal neuroma or bursitis.   IMPRESSION: Unremarkable right foot MRI. Intact Lisfranc ligament. No acute findings to explain the patient's foot pain.     Electronically Signed   By: Caprice Renshaw   On: 07/31/2020 12:57   Study Result  Narrative & Impression  CLINICAL DATA:  Ankle pain, tendon abnormality suspected, neg xray   EXAM: MRI OF THE RIGHT ANKLE WITHOUT  CONTRAST   TECHNIQUE: Multiplanar, multisequence MR imaging of the ankle was performed. No intravenous contrast was administered.   COMPARISON:  Right foot radiograph 05/23/2020   FINDINGS: TENDONS   Peroneal: Peroneal longus tendon intact. Peroneal brevis intact.   Posteromedial: Posterior tibial tendon intact. Flexor digitorum longus tendon intact. Flexor hallucis longus tendon intact.   Anterior: Tibialis anterior tendon intact. Extensor hallucis longus tendon intact Extensor digitorum longus tendon intact.   Achilles:  Intact.   Plantar Fascia: Intact.   LIGAMENTS   Lateral: Anterior talofibular ligament intact. Calcaneofibular ligament intact. Posterior talofibular ligament intact. Anterior and posterior tibiofibular ligaments intact.   Medial: Deltoid ligament intact. Spring ligament intact.   CARTILAGE   Ankle Joint: No joint effusion. Normal ankle mortise. No chondral defect.   Subtalar Joints/Sinus Tarsi: Normal subtalar joints. No subtalar joint effusion. Normal sinus tarsi.   Bones: No marrow signal abnormality.  No fracture or dislocation.   Soft Tissue: No fluid collection or hematoma. Muscles are normal without edema or atrophy. Tarsal tunnel is normal.   IMPRESSION: Unremarkable right ankle MRI. No acute findings to explain the patient's right ankle pain.     Electronically Signed   By: Caprice Renshaw   On: 07/31/2020 12:52     Assessment:   1. Peroneal tendinitis of right lower extremity   2. Closed avulsion fracture of metatarsal bone of right foot, sequela      Plan:  Patient was evaluated and treated and all questions answered.  Reviewed MRI findings with her.  Likely does not have  a Lisfranc tear or sprain.  I do think she had a avulsion injury of the plantar attachment of the peroneus longus in the first metatarsal base.  This combined with peroneal tendinitis I think she would greatly benefit from physical therapy which she has not  done before.  She was in a boot after her injury.  Prescription written for [facility] physical therapy and will be sent to them.  Return to see me before she returns to school for the fall  Return in about 6 weeks (around 09/17/2020).

## 2020-09-02 ENCOUNTER — Ambulatory Visit: Payer: 59 | Admitting: Behavioral Health

## 2020-09-10 ENCOUNTER — Encounter: Payer: Self-pay | Admitting: Behavioral Health

## 2020-09-10 ENCOUNTER — Other Ambulatory Visit: Payer: Self-pay

## 2020-09-10 ENCOUNTER — Ambulatory Visit: Payer: 59 | Admitting: Podiatry

## 2020-09-10 ENCOUNTER — Ambulatory Visit (INDEPENDENT_AMBULATORY_CARE_PROVIDER_SITE_OTHER): Payer: 59 | Admitting: Behavioral Health

## 2020-09-10 DIAGNOSIS — S92301S Fracture of unspecified metatarsal bone(s), right foot, sequela: Secondary | ICD-10-CM

## 2020-09-10 DIAGNOSIS — F411 Generalized anxiety disorder: Secondary | ICD-10-CM | POA: Diagnosis not present

## 2020-09-10 DIAGNOSIS — M7671 Peroneal tendinitis, right leg: Secondary | ICD-10-CM | POA: Diagnosis not present

## 2020-09-10 DIAGNOSIS — F3341 Major depressive disorder, recurrent, in partial remission: Secondary | ICD-10-CM | POA: Diagnosis not present

## 2020-09-10 DIAGNOSIS — F900 Attention-deficit hyperactivity disorder, predominantly inattentive type: Secondary | ICD-10-CM

## 2020-09-10 DIAGNOSIS — S93621S Sprain of tarsometatarsal ligament of right foot, sequela: Secondary | ICD-10-CM

## 2020-09-10 MED ORDER — AMPHETAMINE-DEXTROAMPHET ER 20 MG PO CP24: 20.0000 mg | ORAL_CAPSULE | Freq: Every day | ORAL | 0 refills | Status: DC

## 2020-09-10 NOTE — Progress Notes (Addendum)
[facility]  Patient ID: Maria Pena,  MRN: [id number]  PCP: Aliene Beams, MD  Date of Evaluation: 09/10/2020 Time spent:20 minutes  Chief Complaint:  Chief Complaint   Anxiety; Depression; ADHD; Medication Refill; Follow-up     HISTORY/CURRENT STATUS: HPI  20 year old patient presents to this office for initial visit and to establish care. She says she is not experiencing any improvement with attention and focus with Adderall 10 mg SR . She she continues to get easily distracted before being able to complete one task before moving to another. Says this is a great source of her anxiety. This worries her because the school year is coming up and she usually experiences more anxiety during this time. She would like to try increasing the dosage to see if she improves.  Reports depression a 3/10 today and anxiety at 3/10. Sleeps 7-8 hours per night. No mania, no psychosis. No SI/HI.    No prior psychiatric medication trials    Individual Medical History/ Review of Systems: Changes? :No   Allergies: Colchicine, Nickel, Other, and Soy allergy  Current Medications:  Current Outpatient Medications:    amphetamine-dextroamphetamine (ADDERALL XR) 20 MG 24 hr capsule, Take 1 capsule (20 mg total) by mouth daily., Disp: 30 capsule, Rfl: 0   amphetamine-dextroamphetamine (ADDERALL XR) 10 MG 24 hr capsule, Take 1 capsule (10 mg total) by mouth daily., Disp: 30 capsule, Rfl: 0   Biotin w/ Vitamins C & E (HAIR/SKIN/NAILS PO), Take by mouth., Disp: , Rfl:    Calcium Carb-Cholecalciferol (CALCIUM/VITAMIN D PO), Take by mouth every morning., Disp: , Rfl:    cetirizine (ZYRTEC) 10 MG tablet, Take 10 mg by mouth daily. (Patient not taking: Reported on 08/05/2020), Disp: , Rfl:    citalopram (CELEXA) 10 MG tablet, Take 10 mg by mouth daily., Disp: , Rfl:    cycloSPORINE (SANDIMMUNE) 100 MG capsule, Take 100 mg by mouth 2 (two) times daily. (Patient not taking: Reported on 08/05/2020),  Disp: , Rfl:    diphenhydrAMINE (BENADRYL) 25 MG tablet, Take 1 tablet (25 mg total) by mouth every 6 (six) hours., Disp: 20 tablet, Rfl: 0   EPINEPHrine 0.3 mg/0.3 mL IJ SOAJ injection, Inject into the muscle., Disp: , Rfl:    escitalopram (LEXAPRO) 20 MG tablet, Take 20 mg by mouth at bedtime., Disp: , Rfl:    famotidine (PEPCID) 20 MG tablet, Take 1 tablet (20 mg total) by mouth 2 (two) times daily. (Patient not taking: Reported on 08/05/2020), Disp: 30 tablet, Rfl: 0   fluticasone (FLONASE) 50 MCG/ACT nasal spray, Place 2 sprays into both nostrils daily., Disp: 16 g, Rfl: 6   hydroxychloroquine (PLAQUENIL) 200 MG tablet, Take 200 mg by mouth daily. (Patient not taking: Reported on 08/05/2020), Disp: , Rfl:    hydrOXYzine (ATARAX/VISTARIL) 10 MG tablet, Take 10 mg by mouth 3 (three) times daily as needed. (Patient not taking: Reported on 08/05/2020), Disp: , Rfl:    ibuprofen (ADVIL,MOTRIN) 600 MG tablet, Take 1 tablet (600 mg total) by mouth every 6 (six) hours as needed., Disp: 30 tablet, Rfl: 0   levocetirizine (XYZAL) 5 MG tablet, Take 1 tablet (5 mg total) by mouth every evening. (Patient not taking: Reported on 08/05/2020), Disp: 90 tablet, Rfl: 1   Multiple Vitamin (MULTIVITAMIN WITH MINERALS) TABS tablet, Take 1 tablet by mouth daily., Disp: , Rfl:    NATAZIA 3/2-2/2-3/1 MG tablet, Take 1 tablet by mouth daily., Disp: , Rfl:    Norethindrone-Ethinyl Estradiol Biphasic (NECON 10/11) 0.5-35/1-35  MG-MCG tablet, Take 1 tablet by mouth daily., Disp: , Rfl:    ondansetron (ZOFRAN) 4 MG tablet, Take 1 tablet (4 mg total) by mouth every 8 (eight) hours as needed for nausea or vomiting. (Patient not taking: Reported on 08/05/2020), Disp: 20 tablet, Rfl: 0   Ranitidine HCl (ZANTAC PO), Take 1 tablet by mouth every morning. (Patient not taking: Reported on 08/05/2020), Disp: , Rfl:    Vitamin D, Ergocalciferol, (DRISDOL) 50000 units CAPS capsule, Take 1 capsule (50,000 Units total) by mouth every 7 (seven)  days. (Patient not taking: Reported on 08/05/2020), Disp: 4 capsule, Rfl: 0 Medication Side Effects: none  Family Medical/ Social History: Changes? No  MENTAL HEALTH EXAM:  There were no vitals taken for this visit.There is no height or weight on file to calculate BMI.  General Appearance: Casual and Neat  Eye Contact:  Good  Speech:  Clear and Coherent  Volume:  Normal  Mood:  NA  Affect:  Appropriate  Thought Process:  Coherent  Orientation:  Full (Time, Place, and Person)  Thought Content: Logical   Suicidal Thoughts:  No  Homicidal Thoughts:  No  Memory:  WNL  Judgement:  Good  Insight:  Good  Psychomotor Activity:  Normal  Concentration:  Concentration: Good  Recall:  Good  Fund of Knowledge: Good  Language: Good  Assets:  Desire for Improvement  ADL's:  Intact  Cognition: WNL  Prognosis:  Good    DIAGNOSES:    ICD-10-CM   1. Attention deficit hyperactivity disorder (ADHD), predominantly inattentive type  F90.0 amphetamine-dextroamphetamine (ADDERALL XR) 20 MG 24 hr capsule    2. Generalized anxiety disorder  F41.1 amphetamine-dextroamphetamine (ADDERALL XR) 20 MG 24 hr capsule    3. Recurrent major depressive disorder, in partial remission (HCC)  F33.41 amphetamine-dextroamphetamine (ADDERALL XR) 20 MG 24 hr capsule      Receiving Psychotherapy: No    RECOMMENDATIONS:   Increase Adderall to 20 mg  XR daily. Continue Celexa 10 mg daily Will report side effects or worsening symptoms promptly. Will follow up in 4 weeks to reevaluate Provided emergency contact information Greater than 50% of 20 min face to face time with patient was spent on counseling and coordination of care.  We discussed recently new trial of Adderall10 mg. Pt is reporting no improvement with current dose.  Agreed to increase dose and follow up in 3 months. She will call if needed sooner. We discussed potential benefits, risks, and side effects of stimulants with patient to include increased  heart rate, palpitations, insomnia, increased anxiety, increased irritability, or decreased appetite.  Instructed patient to contact office if experiencing any significant tolerability issues. Patient was referred to psych testing but understands there could be 2 month wait for appointment. She understands that she will have to follow up every three months on regular basis to receive stimulant medication. PDMP was reviewed and presented no known risk factors.     Joan Flores, NP

## 2020-09-15 NOTE — Progress Notes (Signed)
Subjective:  Patient ID: Maria Pena, female    DOB: 12-29-2000,  MRN: [id number]  Chief Complaint  Patient presents with   Follow-up    Right foot f/u pt states foot has been better since working with PT    20 y.o. female returns for follow-up with the above complaint. History confirmed with patient.  She is doing well PT has helped quite a bit  Objective:  Physical Exam: warm, good capillary refill, no trophic changes or ulcerative lesions, normal DP and PT pulses and normal sensory exam.  Overall doing well no pain with resisted eversion good 5 out of 5 strength now no gross instability  Radiographs: X-ray of the right foot: no fracture, dislocation, swelling or degenerative changes noted  Study Result  Narrative & Impression  CLINICAL DATA:  Foot trauma, Lisfranc suspected, xray done (Age >= 6y)   EXAM: MRI OF THE RIGHT FOREFOOT WITHOUT CONTRAST   TECHNIQUE: Multiplanar, multivalence MR imaging of the right foot was performed. No intravenous contrast was administered.   COMPARISON:  Right foot radiograph 05/23/2020   FINDINGS: Bones/Joint/Cartilage   There is no evidence of fracture. The cortex is intact. There is no significant marrow signal alteration.   Ligaments   Intact Lisfranc ligament.  No evidence of plantar plate tear   Muscles and Tendons   No significant muscle atrophy. No acute tendon tear in the forefoot.   Soft tissues   No evidence of soft tissue mass. There is no evidence of intermetatarsal neuroma or bursitis.   IMPRESSION: Unremarkable right foot MRI. Intact Lisfranc ligament. No acute findings to explain the patient's foot pain.     Electronically Signed   By: Caprice Renshaw   On: 07/31/2020 12:57   Study Result  Narrative & Impression  CLINICAL DATA:  Ankle pain, tendon abnormality suspected, neg xray   EXAM: MRI OF THE RIGHT ANKLE WITHOUT CONTRAST   TECHNIQUE: Multiplanar, multisequence MR imaging of the ankle was  performed. No intravenous contrast was administered.   COMPARISON:  Right foot radiograph 05/23/2020   FINDINGS: TENDONS   Peroneal: Peroneal longus tendon intact. Peroneal brevis intact.   Posteromedial: Posterior tibial tendon intact. Flexor digitorum longus tendon intact. Flexor hallucis longus tendon intact.   Anterior: Tibialis anterior tendon intact. Extensor hallucis longus tendon intact Extensor digitorum longus tendon intact.   Achilles:  Intact.   Plantar Fascia: Intact.   LIGAMENTS   Lateral: Anterior talofibular ligament intact. Calcaneofibular ligament intact. Posterior talofibular ligament intact. Anterior and posterior tibiofibular ligaments intact.   Medial: Deltoid ligament intact. Spring ligament intact.   CARTILAGE   Ankle Joint: No joint effusion. Normal ankle mortise. No chondral defect.   Subtalar Joints/Sinus Tarsi: Normal subtalar joints. No subtalar joint effusion. Normal sinus tarsi.   Bones: No marrow signal abnormality.  No fracture or dislocation.   Soft Tissue: No fluid collection or hematoma. Muscles are normal without edema or atrophy. Tarsal tunnel is normal.   IMPRESSION: Unremarkable right ankle MRI. No acute findings to explain the patient's right ankle pain.     Electronically Signed   By: Caprice Renshaw   On: 07/31/2020 12:52     Assessment:   1. Peroneal tendinitis of right lower extremity   2. Sprain of ligament of tarsometatarsal joint of right foot, sequela   3. Closed avulsion fracture of metatarsal bone of right foot, sequela       Plan:  Patient was evaluated and treated and all questions answered.  Overall doing well she  is improved quite a bit with physical therapy.  I think she continue this as she goes back to school until she is more comfortable.  Advised to wear an ankle brace while she resumes lacrosse for stability.  Alternatively could try [facility] boot strapping with a trainer.  She would likely continue  physical therapy and [place] and she will let me know where she will plan to do this and I can send a referral for her as needed  Return if symptoms worsen or fail to improve.

## 2020-09-16 ENCOUNTER — Telehealth: Payer: Self-pay | Admitting: Podiatry

## 2020-09-16 DIAGNOSIS — S92301S Fracture of unspecified metatarsal bone(s), right foot, sequela: Secondary | ICD-10-CM

## 2020-09-16 DIAGNOSIS — M7671 Peroneal tendinitis, right leg: Secondary | ICD-10-CM

## 2020-09-16 NOTE — Telephone Encounter (Signed)
Pt is requesting her last week of Physical Therapy at [facility] due to her moving into her new appointment.  Please send referral

## 2020-09-18 NOTE — Addendum Note (Signed)
Addended byLilian Kapur, Cindy Fullman R on: 09/18/2020 08:00 AM   Modules accepted: Orders

## 2020-10-09 ENCOUNTER — Encounter: Payer: Self-pay | Admitting: Behavioral Health

## 2020-10-23 ENCOUNTER — Telehealth: Payer: Self-pay | Admitting: Behavioral Health

## 2020-10-23 NOTE — Telephone Encounter (Signed)
Pt called requesting Adderall  XR 20 mg Rx to new [facility] location [address] [place] [address]  [facility] Phone: [phone].

## 2020-10-24 ENCOUNTER — Other Ambulatory Visit: Payer: Self-pay

## 2020-10-24 DIAGNOSIS — F900 Attention-deficit hyperactivity disorder, predominantly inattentive type: Secondary | ICD-10-CM

## 2020-10-24 MED ORDER — AMPHETAMINE-DEXTROAMPHET ER 20 MG PO CP24: 20.0000 mg | ORAL_CAPSULE | Freq: Every day | ORAL | 0 refills | Status: DC

## 2020-10-24 NOTE — Telephone Encounter (Signed)
Rx pended for [name] to send to update pharmacy  Will cancel Rx at [facility]

## 2020-10-31 ENCOUNTER — Telehealth: Payer: Self-pay | Admitting: Psychiatry

## 2020-10-31 NOTE — Telephone Encounter (Signed)
Received after hours call due to pt calling and reporting possible worsening anxiety and depression due to medications. Returned call to pt. She reports for the past few weeks she has been having severe anxiety and just had a severe anxiety attack, which has not happened since she was 20 yo. She reports having n/v in situations that have triggered anxiety. She reports poor sleep and had been sleeping about 4-5 hours a night.   She reports that she has had increased depression in response to worsening anxiety. Denies irritability. Denies SI.   Taking Escitalopram 20 mg and concerned about med combination with Adderall XR 20 mg. She reports that Adderall XR 20 mg has not been helpful for her concentration.   Not taking Celexa. Celexa removed from med list.   HR has been 45-85 today.   Advised to stop taking Adderall XR.  Will relay information to her provider, Maria Laine, NP. Pt reports that she is open to scheduling earlier follow-up apt.

## 2020-11-01 NOTE — Telephone Encounter (Signed)
Message received and noted correction she is taking Escitalopram 20 mg  and not the Citalopram anymore. Called the pt this am and will be working her in for sooner appointment. Thank you.

## 2020-11-27 ENCOUNTER — Other Ambulatory Visit: Payer: Self-pay

## 2020-11-27 ENCOUNTER — Encounter: Payer: Self-pay | Admitting: Behavioral Health

## 2020-11-27 ENCOUNTER — Ambulatory Visit: Payer: 59 | Admitting: Behavioral Health

## 2020-11-27 DIAGNOSIS — F5105 Insomnia due to other mental disorder: Secondary | ICD-10-CM

## 2020-11-27 DIAGNOSIS — F3341 Major depressive disorder, recurrent, in partial remission: Secondary | ICD-10-CM

## 2020-11-27 DIAGNOSIS — F411 Generalized anxiety disorder: Secondary | ICD-10-CM | POA: Diagnosis not present

## 2020-11-27 DIAGNOSIS — F99 Mental disorder, not otherwise specified: Secondary | ICD-10-CM

## 2020-11-27 DIAGNOSIS — F900 Attention-deficit hyperactivity disorder, predominantly inattentive type: Secondary | ICD-10-CM | POA: Diagnosis not present

## 2020-11-27 MED ORDER — HYDROXYZINE PAMOATE 25 MG PO CAPS: 25.0000 mg | ORAL_CAPSULE | Freq: Every evening | ORAL | 2 refills | Status: DC | PRN

## 2020-11-27 MED ORDER — SERTRALINE HCL 50 MG PO TABS: ORAL_TABLET | ORAL | 1 refills | Status: DC

## 2020-11-27 NOTE — Progress Notes (Signed)
[facility]  Patient ID: Maria Pena,  MRN: [id number]  PCP: Aliene Beams, MD  Date of Evaluation: 11/27/2020 Time spent:30 minutes  Chief Complaint:  Chief Complaint   Anxiety; Depression; Panic Attack; Follow-up; Medication Problem; Medication Refill     HISTORY/CURRENT STATUS: HPI  20 year old patient presents to this office for follow up and medication management. She says she has continued to experience some anxiety and depression. She is not taking her Adderall right now due to fear of making anxiety worse.. She she continues to get easily distracted before being able to complete one task before moving to another, but wants to wait to see if AD continues to help. Says this is a great source of her anxiety. This worries her because the school year is coming up and she usually experiences more anxiety during this time. She would like to try increasing the dosage to see if she improves.  Reports depression a 2/10 today and anxiety at 3/10. Her sleep has recently declined and is only getting about 4 hours per night. . No mania, no psychosis. No SI/HI.   Individual Medical History/ Review of Systems: Changes? :No   Allergies: Colchicine, Nickel, Other, and Soy allergy  Current Medications:  Current Outpatient Medications:    hydrOXYzine (VISTARIL) 25 MG capsule, Take 1 capsule (25 mg total) by mouth at bedtime as needed., Disp: 30 capsule, Rfl: 2   sertraline (ZOLOFT) 50 MG tablet, Take one half tablet 25 mg for 7 days, then one whole tablet 50 mg total daily., Disp: 30 tablet, Rfl: 1   amphetamine-dextroamphetamine (ADDERALL XR) 20 MG 24 hr capsule, Take 1 capsule (20 mg total) by mouth daily., Disp: 30 capsule, Rfl: 0   amphetamine-dextroamphetamine (ADDERALL XR) 20 MG 24 hr capsule, Take 1 capsule (20 mg total) by mouth daily., Disp: 30 capsule, Rfl: 0   Biotin w/ Vitamins C & E (HAIR/SKIN/NAILS PO), Take by mouth., Disp: , Rfl:    Calcium Carb-Cholecalciferol  (CALCIUM/VITAMIN D PO), Take by mouth every morning., Disp: , Rfl:    cetirizine (ZYRTEC) 10 MG tablet, Take 10 mg by mouth daily. (Patient not taking: Reported on 08/05/2020), Disp: , Rfl:    cycloSPORINE (SANDIMMUNE) 100 MG capsule, Take 100 mg by mouth 2 (two) times daily. (Patient not taking: Reported on 08/05/2020), Disp: , Rfl:    diphenhydrAMINE (BENADRYL) 25 MG tablet, Take 1 tablet (25 mg total) by mouth every 6 (six) hours., Disp: 20 tablet, Rfl: 0   EPINEPHrine 0.3 mg/0.3 mL IJ SOAJ injection, Inject into the muscle., Disp: , Rfl:    escitalopram (LEXAPRO) 20 MG tablet, Take 20 mg by mouth at bedtime., Disp: , Rfl:    famotidine (PEPCID) 20 MG tablet, Take 1 tablet (20 mg total) by mouth 2 (two) times daily. (Patient not taking: Reported on 08/05/2020), Disp: 30 tablet, Rfl: 0   fluticasone (FLONASE) 50 MCG/ACT nasal spray, Place 2 sprays into both nostrils daily., Disp: 16 g, Rfl: 6   hydroxychloroquine (PLAQUENIL) 200 MG tablet, Take 200 mg by mouth daily. (Patient not taking: Reported on 08/05/2020), Disp: , Rfl:    hydrOXYzine (ATARAX/VISTARIL) 10 MG tablet, Take 10 mg by mouth 3 (three) times daily as needed. (Patient not taking: Reported on 08/05/2020), Disp: , Rfl:    ibuprofen (ADVIL,MOTRIN) 600 MG tablet, Take 1 tablet (600 mg total) by mouth every 6 (six) hours as needed., Disp: 30 tablet, Rfl: 0   levocetirizine (XYZAL) 5 MG tablet, Take 1 tablet (5 mg total)  by mouth every evening. (Patient not taking: Reported on 08/05/2020), Disp: 90 tablet, Rfl: 1   Multiple Vitamin (MULTIVITAMIN WITH MINERALS) TABS tablet, Take 1 tablet by mouth daily., Disp: , Rfl:    NATAZIA 3/2-2/2-3/1 MG tablet, Take 1 tablet by mouth daily., Disp: , Rfl:    ondansetron (ZOFRAN) 4 MG tablet, Take 1 tablet (4 mg total) by mouth every 8 (eight) hours as needed for nausea or vomiting. (Patient not taking: Reported on 08/05/2020), Disp: 20 tablet, Rfl: 0   Ranitidine HCl (ZANTAC PO), Take 1 tablet by mouth every  morning. (Patient not taking: Reported on 08/05/2020), Disp: , Rfl:    Vitamin D, Ergocalciferol, (DRISDOL) 50000 units CAPS capsule, Take 1 capsule (50,000 Units total) by mouth every 7 (seven) days. (Patient not taking: Reported on 08/05/2020), Disp: 4 capsule, Rfl: 0 Medication Side Effects: none  Family Medical/ Social History: Changes? No  MENTAL HEALTH EXAM:  There were no vitals taken for this visit.There is no height or weight on file to calculate BMI.  General Appearance: Casual, Neat, and Well Groomed  Eye Contact:  Good  Speech:  Clear and Coherent  Volume:  Normal  Mood:  Anxious  Affect:  Appropriate  Thought Process:   normal  Orientation:  Logical  Thought Content: Full (Time, Place, and Person)   Suicidal Thoughts:  No  Homicidal Thoughts:  No  Memory:  WNL  Judgement:  Good  Insight:  Good  Psychomotor Activity:  Normal  Concentration:  Concentration: Good  Recall:  Good  Fund of Knowledge: Good  Language: Good  Assets:  Desire for Improvement  ADL's:  Intact  Cognition: WNL  Prognosis:  Good    DIAGNOSES:    ICD-10-CM   1. Attention deficit hyperactivity disorder (ADHD), predominantly inattentive type  F90.0 sertraline (ZOLOFT) 50 MG tablet    2. Generalized anxiety disorder  F41.1 sertraline (ZOLOFT) 50 MG tablet    3. Recurrent major depressive disorder, in partial remission (HCC)  F33.41 sertraline (ZOLOFT) 50 MG tablet    4. Insomnia due to other mental disorder  F51.05 hydrOXYzine (VISTARIL) 25 MG capsule   F99       Receiving Psychotherapy: No    RECOMMENDATIONS:   Pt is not currently taking her Adderall due to anxiety. To start Zoloft 25 daily for 7 days, then 50 mg daily. To reduce Lexapro to 10 mg for 7 days, then stop Will report side effects or worsening symptoms promptly. Will follow up in 4 weeks to reevaluate Provided emergency contact information Greater than 50% of 30 min face to face time with patient was spent on counseling  and coordination of care. We discussed her current problems with anxiety and insomnia. She has shown some improvement but understands dosage parameters of Lexapro. PDMP was reviewed and presented no known additional risk factors or addictive behaviors      Joan Flores, NP

## 2020-12-21 ENCOUNTER — Other Ambulatory Visit: Payer: Self-pay | Admitting: Behavioral Health

## 2020-12-21 DIAGNOSIS — F411 Generalized anxiety disorder: Secondary | ICD-10-CM

## 2020-12-21 DIAGNOSIS — F5105 Insomnia due to other mental disorder: Secondary | ICD-10-CM

## 2020-12-21 DIAGNOSIS — F3341 Major depressive disorder, recurrent, in partial remission: Secondary | ICD-10-CM

## 2020-12-21 DIAGNOSIS — F900 Attention-deficit hyperactivity disorder, predominantly inattentive type: Secondary | ICD-10-CM

## 2020-12-27 ENCOUNTER — Ambulatory Visit: Payer: 59 | Admitting: Behavioral Health

## 2021-01-01 ENCOUNTER — Ambulatory Visit: Payer: 59 | Admitting: Behavioral Health

## 2021-01-01 ENCOUNTER — Encounter: Payer: Self-pay | Admitting: Behavioral Health

## 2021-01-01 ENCOUNTER — Other Ambulatory Visit: Payer: Self-pay

## 2021-01-01 DIAGNOSIS — F900 Attention-deficit hyperactivity disorder, predominantly inattentive type: Secondary | ICD-10-CM

## 2021-01-01 DIAGNOSIS — F3341 Major depressive disorder, recurrent, in partial remission: Secondary | ICD-10-CM | POA: Diagnosis not present

## 2021-01-01 DIAGNOSIS — F411 Generalized anxiety disorder: Secondary | ICD-10-CM

## 2021-01-01 MED ORDER — SERTRALINE HCL 50 MG PO TABS: 50.0000 mg | ORAL_TABLET | Freq: Every day | ORAL | 3 refills | Status: DC

## 2021-01-01 NOTE — Progress Notes (Signed)
[facility]  Patient ID: Maria Pena,  MRN: [id number]  PCP: Aliene Beams, MD  Date of Evaluation: 01/01/2021 Time spent:20 minutes  Chief Complaint:  Chief Complaint   Depression; Anxiety; Insomnia     HISTORY/CURRENT STATUS: HPI   20 year old patient presents to this office for follow up and medication management. She says that she has been doing very well since switching to Zoloft. Says anxiety and depression have been well controlled. She does not want to make any medication changes at this time.  Reports depression a 2/10 today and anxiety at 1/10. Her sleep has improved with taking hydroxyzine when needed. She is getting 7-8 hours per night . No mania, no psychosis. No SI/HI.  Past psychotropic medications: Lexapro Hydroxyzine   Individual Medical History/ Review of Systems: Changes? :No   Allergies: Colchicine, Nickel, Other, and Soy allergy  Current Medications:  Current Outpatient Medications:    Biotin w/ Vitamins C & E (HAIR/SKIN/NAILS PO), Take by mouth., Disp: , Rfl:    Calcium Carb-Cholecalciferol (CALCIUM/VITAMIN D PO), Take by mouth every morning., Disp: , Rfl:    cetirizine (ZYRTEC) 10 MG tablet, Take 10 mg by mouth daily. (Patient not taking: Reported on 08/05/2020), Disp: , Rfl:    cycloSPORINE (SANDIMMUNE) 100 MG capsule, Take 100 mg by mouth 2 (two) times daily. (Patient not taking: Reported on 08/05/2020), Disp: , Rfl:    EPINEPHrine 0.3 mg/0.3 mL IJ SOAJ injection, Inject into the muscle., Disp: , Rfl:    famotidine (PEPCID) 20 MG tablet, Take 1 tablet (20 mg total) by mouth 2 (two) times daily. (Patient not taking: Reported on 08/05/2020), Disp: 30 tablet, Rfl: 0   fluticasone (FLONASE) 50 MCG/ACT nasal spray, Place 2 sprays into both nostrils daily., Disp: 16 g, Rfl: 6   hydroxychloroquine (PLAQUENIL) 200 MG tablet, Take 200 mg by mouth daily. (Patient not taking: Reported on 08/05/2020), Disp: , Rfl:    hydrOXYzine (VISTARIL) 25 MG  capsule, TAKE 1 CAPSULE (25 MG TOTAL) BY MOUTH AT BEDTIME AS NEEDED., Disp: 90 capsule, Rfl: 0   ibuprofen (ADVIL,MOTRIN) 600 MG tablet, Take 1 tablet (600 mg total) by mouth every 6 (six) hours as needed., Disp: 30 tablet, Rfl: 0   levocetirizine (XYZAL) 5 MG tablet, Take 1 tablet (5 mg total) by mouth every evening. (Patient not taking: Reported on 08/05/2020), Disp: 90 tablet, Rfl: 1   Multiple Vitamin (MULTIVITAMIN WITH MINERALS) TABS tablet, Take 1 tablet by mouth daily., Disp: , Rfl:    NATAZIA 3/2-2/2-3/1 MG tablet, Take 1 tablet by mouth daily., Disp: , Rfl:    ondansetron (ZOFRAN) 4 MG tablet, Take 1 tablet (4 mg total) by mouth every 8 (eight) hours as needed for nausea or vomiting. (Patient not taking: Reported on 08/05/2020), Disp: 20 tablet, Rfl: 0   Ranitidine HCl (ZANTAC PO), Take 1 tablet by mouth every morning. (Patient not taking: Reported on 08/05/2020), Disp: , Rfl:    sertraline (ZOLOFT) 50 MG tablet, Take 1 tablet (50 mg total) by mouth daily., Disp: 90 tablet, Rfl: 3   Vitamin D, Ergocalciferol, (DRISDOL) 50000 units CAPS capsule, Take 1 capsule (50,000 Units total) by mouth every 7 (seven) days. (Patient not taking: Reported on 08/05/2020), Disp: 4 capsule, Rfl: 0 Medication Side Effects: anxiety  Family Medical/ Social History: Changes? No  MENTAL HEALTH EXAM:  There were no vitals taken for this visit.There is no height or weight on file to calculate BMI.  General Appearance: Casual  Eye Contact:  Good  Speech:  Clear and Coherent  Volume:  Normal  Mood:  NA  Affect:  Appropriate  Thought Process:  Coherent  Orientation:  Full (Time, Place, and Person)  Thought Content: Logical   Suicidal Thoughts:  No  Homicidal Thoughts:  No  Memory:  WNL  Judgement:  Good  Insight:  Good  Psychomotor Activity:  Normal  Concentration:  Concentration: Good  Recall:  Good  Fund of Knowledge: Good  Language: Good  Assets:  Desire for Improvement  ADL's:  Intact  Cognition:  WNL  Prognosis:  Good    DIAGNOSES:    ICD-10-CM   1. Attention deficit hyperactivity disorder (ADHD), predominantly inattentive type  F90.0 sertraline (ZOLOFT) 50 MG tablet    2. Generalized anxiety disorder  F41.1 sertraline (ZOLOFT) 50 MG tablet    3. Recurrent major depressive disorder, in partial remission (HCC)  F33.41 sertraline (ZOLOFT) 50 MG tablet      Receiving Psychotherapy: No    RECOMMENDATIONS:   Pt is not currently taking her Adderall due to anxiety. Continue Zoloft 50 mg daily Continue Hydroxyzine 25 mg at bedtime for sleep Will report side effects or worsening symptoms promptly. Will follow up in 3 monts to reevaluate Provided emergency contact information Greater than 50% of 30 min face to face time with patient was spent on counseling and coordination of care. We discussed her recent improvements with anxiety and depression. She is happy with switching to zoloft. She feels stable at this time and would like no changes to meds. PDMP was reviewed and presented no known additional risk factors or addictive behaviors       Elwanda Brooklyn, NP

## 2021-03-26 ENCOUNTER — Other Ambulatory Visit: Payer: Self-pay | Admitting: Behavioral Health

## 2021-03-26 DIAGNOSIS — F5105 Insomnia due to other mental disorder: Secondary | ICD-10-CM

## 2021-04-04 ENCOUNTER — Ambulatory Visit: Payer: 59 | Admitting: Behavioral Health

## 2021-04-29 ENCOUNTER — Other Ambulatory Visit: Payer: Self-pay | Admitting: Behavioral Health

## 2021-04-29 DIAGNOSIS — F5105 Insomnia due to other mental disorder: Secondary | ICD-10-CM

## 2021-05-02 ENCOUNTER — Ambulatory Visit: Payer: 59 | Admitting: Behavioral Health

## 2021-06-06 ENCOUNTER — Ambulatory Visit: Payer: 59 | Admitting: Behavioral Health

## 2021-06-06 ENCOUNTER — Encounter: Payer: Self-pay | Admitting: Behavioral Health

## 2021-06-06 DIAGNOSIS — F900 Attention-deficit hyperactivity disorder, predominantly inattentive type: Secondary | ICD-10-CM | POA: Diagnosis not present

## 2021-06-06 DIAGNOSIS — F3341 Major depressive disorder, recurrent, in partial remission: Secondary | ICD-10-CM | POA: Diagnosis not present

## 2021-06-06 DIAGNOSIS — F5105 Insomnia due to other mental disorder: Secondary | ICD-10-CM | POA: Diagnosis not present

## 2021-06-06 DIAGNOSIS — F411 Generalized anxiety disorder: Secondary | ICD-10-CM | POA: Diagnosis not present

## 2021-06-06 DIAGNOSIS — F99 Mental disorder, not otherwise specified: Secondary | ICD-10-CM

## 2021-06-06 MED ORDER — TRAZODONE HCL 50 MG PO TABS: 50.0000 mg | ORAL_TABLET | Freq: Every day | ORAL | 1 refills | Status: DC

## 2021-06-06 MED ORDER — ATOMOXETINE HCL 40 MG PO CAPS: 40.0000 mg | ORAL_CAPSULE | Freq: Every day | ORAL | 1 refills | Status: DC

## 2021-06-06 NOTE — Progress Notes (Signed)
[facility] ? ?Patient ID: Maria Pena,  ?MRN: [id number] ? ?PCP: Aliene Beams, MD ? ?Date of Evaluation: 06/06/2021 ?Time spent:30 minutes ? ?Chief Complaint:  ? ?HISTORY/CURRENT STATUS: ?HPI ? ?21 year old patient presents to this office for follow up and medication management. She says that she has been doing very well since switching to Zoloft. Says anxiety and depression have been well controlled. She completed psych testing at [facility] and would like to try a non stimulant to help with attention and focus.  Reports depression a 3/10 today and anxiety at 1/10. Her sleep has improved with taking hydroxyzine when needed. She is getting 7-8 hours per night . No mania, no psychosis. No SI/HI. ?  ?Past psychotropic medications: ?Lexapro ?Hydroxyzine ? ? ? ? ? ?Individual Medical History/ Review of Systems: Changes? :No  ? ?Allergies: Colchicine, Nickel, Other, and Soy allergy ? ?Current Medications:  ?Current Outpatient Medications:  ?  atomoxetine (STRATTERA) 40 MG capsule, Take 1 capsule (40 mg total) by mouth daily., Disp: 30 capsule, Rfl: 1 ?  traZODone (DESYREL) 50 MG tablet, Take 1 tablet (50 mg total) by mouth at bedtime., Disp: 30 tablet, Rfl: 1 ?  Biotin w/ Vitamins C & E (HAIR/SKIN/NAILS PO), Take by mouth., Disp: , Rfl:  ?  Calcium Carb-Cholecalciferol (CALCIUM/VITAMIN D PO), Take by mouth every morning., Disp: , Rfl:  ?  cetirizine (ZYRTEC) 10 MG tablet, Take 10 mg by mouth daily. (Patient not taking: Reported on 08/05/2020), Disp: , Rfl:  ?  cycloSPORINE (SANDIMMUNE) 100 MG capsule, Take 100 mg by mouth 2 (two) times daily. (Patient not taking: Reported on 08/05/2020), Disp: , Rfl:  ?  EPINEPHrine 0.3 mg/0.3 mL IJ SOAJ injection, Inject into the muscle., Disp: , Rfl:  ?  famotidine (PEPCID) 20 MG tablet, Take 1 tablet (20 mg total) by mouth 2 (two) times daily. (Patient not taking: Reported on 08/05/2020), Disp: 30 tablet, Rfl: 0 ?  fluticasone (FLONASE) 50 MCG/ACT nasal spray,  Place 2 sprays into both nostrils daily., Disp: 16 g, Rfl: 6 ?  hydroxychloroquine (PLAQUENIL) 200 MG tablet, Take 200 mg by mouth daily. (Patient not taking: Reported on 08/05/2020), Disp: , Rfl:  ?  hydrOXYzine (VISTARIL) 25 MG capsule, TAKE 1 CAPSULE (25 MG TOTAL) BY MOUTH AT BEDTIME AS NEEDED., Disp: 90 capsule, Rfl: 0 ?  ibuprofen (ADVIL,MOTRIN) 600 MG tablet, Take 1 tablet (600 mg total) by mouth every 6 (six) hours as needed., Disp: 30 tablet, Rfl: 0 ?  levocetirizine (XYZAL) 5 MG tablet, Take 1 tablet (5 mg total) by mouth every evening. (Patient not taking: Reported on 08/05/2020), Disp: 90 tablet, Rfl: 1 ?  Multiple Vitamin (MULTIVITAMIN WITH MINERALS) TABS tablet, Take 1 tablet by mouth daily., Disp: , Rfl:  ?  NATAZIA 3/2-2/2-3/1 MG tablet, Take 1 tablet by mouth daily., Disp: , Rfl:  ?  ondansetron (ZOFRAN) 4 MG tablet, Take 1 tablet (4 mg total) by mouth every 8 (eight) hours as needed for nausea or vomiting. (Patient not taking: Reported on 08/05/2020), Disp: 20 tablet, Rfl: 0 ?  Ranitidine HCl (ZANTAC PO), Take 1 tablet by mouth every morning. (Patient not taking: Reported on 08/05/2020), Disp: , Rfl:  ?  sertraline (ZOLOFT) 50 MG tablet, Take 1 tablet (50 mg total) by mouth daily., Disp: 90 tablet, Rfl: 3 ?  Vitamin D, Ergocalciferol, (DRISDOL) 50000 units CAPS capsule, Take 1 capsule (50,000 Units total) by mouth every 7 (seven) days. (Patient not taking: Reported on 08/05/2020), Disp: 4 capsule, Rfl:  0 ?Medication Side Effects: none ? ?Family Medical/ Social History: Changes? No ? ?MENTAL HEALTH EXAM: ? ?There were no vitals taken for this visit.There is no height or weight on file to calculate BMI.  ?General Appearance: Casual, Neat, and Well Groomed  ?Eye Contact:  Good  ?Speech:  Clear and Coherent  ?Volume:  Normal  ?Mood:  NA  ?Affect:  Appropriate  ?Thought Process:  Coherent  ?Orientation:  Full (Time, Place, and Person)  ?Thought Content: Logical   ?Suicidal Thoughts:  No  ?Homicidal Thoughts:   No  ?Memory:  WNL  ?Judgement:  Good  ?Insight:  Good  ?Psychomotor Activity:  Normal  ?Concentration:  Concentration: Good  ?Recall:  Good  ?Fund of Knowledge: Good  ?Language: Good  ?Assets:  Desire for Improvement  ?ADL's:  Intact  ?Cognition: WNL  ?Prognosis:  Good  ? ? ?DIAGNOSES:  ?  ICD-10-CM   ?1. Attention deficit hyperactivity disorder (ADHD), predominantly inattentive type  F90.0 atomoxetine (STRATTERA) 40 MG capsule  ?  ?2. Generalized anxiety disorder  F41.1   ?  ?3. Recurrent major depressive disorder, in partial remission (HCC)  F33.41   ?  ?4. Insomnia due to other mental disorder  F51.05 traZODone (DESYREL) 50 MG tablet  ? F99   ?  ? ? ?Receiving Psychotherapy: No  ? ? ?RECOMMENDATIONS:  ? ?To Start Stratterra 40 mg daily.  ?Continue Zoloft 50 mg daily ?Continue Hydroxyzine 25 mg at bedtime for sleep ?Will report side effects or worsening symptoms promptly. ?Will follow up in 4 weeks to reevaluate ?Provided emergency contact information ?Greater than 50% of 30 min face to face time with patient was spent on counseling and coordination of care. We discussed her recent improvements with anxiety and depression. She is happy with switching to zoloft. She has had completed psych testing for ADHD at her school at [facility] an it was positive for ADHD predominately inattentive type. She is to provide copies to this office. ?She would like to try a non stimulant due to increased anxiety with Adderrall.  ? ? ? ? ? ? ?  ? ? ? ? ? ? ?Joan Flores, NP  ?

## 2021-07-04 ENCOUNTER — Other Ambulatory Visit: Payer: Self-pay | Admitting: Behavioral Health

## 2021-07-04 DIAGNOSIS — F900 Attention-deficit hyperactivity disorder, predominantly inattentive type: Secondary | ICD-10-CM

## 2021-07-04 DIAGNOSIS — F5105 Insomnia due to other mental disorder: Secondary | ICD-10-CM

## 2021-07-04 NOTE — Telephone Encounter (Signed)
Has appt with Aaron Edelman next week. Will check with patient Tuesday to be sure she has enough until appt.

## 2021-07-09 NOTE — Telephone Encounter (Signed)
Patient said she had enough medication and could wait until seeing [name] on Friday.

## 2021-07-09 NOTE — Telephone Encounter (Signed)
I am going to refuse these then he can send them if needed.

## 2021-07-11 ENCOUNTER — Encounter: Payer: Self-pay | Admitting: Behavioral Health

## 2021-07-11 ENCOUNTER — Ambulatory Visit: Payer: 59 | Admitting: Behavioral Health

## 2021-07-11 DIAGNOSIS — F900 Attention-deficit hyperactivity disorder, predominantly inattentive type: Secondary | ICD-10-CM | POA: Diagnosis not present

## 2021-07-11 DIAGNOSIS — F3341 Major depressive disorder, recurrent, in partial remission: Secondary | ICD-10-CM

## 2021-07-11 DIAGNOSIS — F411 Generalized anxiety disorder: Secondary | ICD-10-CM

## 2021-07-11 DIAGNOSIS — J301 Allergic rhinitis due to pollen: Secondary | ICD-10-CM

## 2021-07-11 DIAGNOSIS — F5105 Insomnia due to other mental disorder: Secondary | ICD-10-CM

## 2021-07-11 DIAGNOSIS — F99 Mental disorder, not otherwise specified: Secondary | ICD-10-CM

## 2021-07-11 MED ORDER — HYDROXYZINE PAMOATE 25 MG PO CAPS: 25.0000 mg | ORAL_CAPSULE | Freq: Every evening | ORAL | 2 refills | Status: DC | PRN

## 2021-07-11 MED ORDER — ATOMOXETINE HCL 40 MG PO CAPS: 40.0000 mg | ORAL_CAPSULE | Freq: Every day | ORAL | 3 refills | Status: DC

## 2021-07-11 MED ORDER — TRAZODONE HCL 50 MG PO TABS: 50.0000 mg | ORAL_TABLET | Freq: Every day | ORAL | 3 refills | Status: DC

## 2021-07-11 MED ORDER — SERTRALINE HCL 50 MG PO TABS: 50.0000 mg | ORAL_TABLET | Freq: Every day | ORAL | 3 refills | Status: DC

## 2021-07-11 NOTE — Progress Notes (Signed)
[facility]  Patient ID: Ryonna Cimini,  MRN: [id number]  PCP: Aliene Beams, MD  Date of Evaluation: 07/11/2021 Time spent:20 minutes  Chief Complaint:  Chief Complaint   Anxiety; Depression; ADHD; Follow-up; Medication Refill     HISTORY/CURRENT STATUS: HPI 21 year old patient presents to this office for follow up and medication management. No significant changes since last visit. No social changes. Relationship with boyfriend still going well. She says that she has been steadily improving since switching to Zoloft. Says anxiety and depression have been well controlled. Very happy with the Strattera. She made it through the semester with good grades.  Reports depression a 2/10 today and anxiety at 1/10. Her sleep has improved with taking hydroxyzine when needed. She is getting 7-8 hours per night . No mania, no psychosis. No SI/HI.   Past psychotropic medications: Lexapro Hydroxyzine     Individual Medical History/ Review of Systems: Changes? :No   Allergies: Colchicine, Nickel, Other, and Soy allergy  Current Medications:  Current Outpatient Medications:    atomoxetine (STRATTERA) 40 MG capsule, Take 1 capsule (40 mg total) by mouth daily., Disp: 30 capsule, Rfl: 3   Biotin w/ Vitamins C & E (HAIR/SKIN/NAILS PO), Take by mouth., Disp: , Rfl:    Calcium Carb-Cholecalciferol (CALCIUM/VITAMIN D PO), Take by mouth every morning., Disp: , Rfl:    cetirizine (ZYRTEC) 10 MG tablet, Take 10 mg by mouth daily. (Patient not taking: Reported on 08/05/2020), Disp: , Rfl:    cycloSPORINE (SANDIMMUNE) 100 MG capsule, Take 100 mg by mouth 2 (two) times daily. (Patient not taking: Reported on 08/05/2020), Disp: , Rfl:    EPINEPHrine 0.3 mg/0.3 mL IJ SOAJ injection, Inject into the muscle., Disp: , Rfl:    fluticasone (FLONASE) 50 MCG/ACT nasal spray, Place 2 sprays into both nostrils daily., Disp: 16 g, Rfl: 6   hydrOXYzine (VISTARIL) 25 MG capsule, Take 1 capsule (25 mg total) by  mouth at bedtime as needed., Disp: 90 capsule, Rfl: 2   Multiple Vitamin (MULTIVITAMIN WITH MINERALS) TABS tablet, Take 1 tablet by mouth daily., Disp: , Rfl:    NATAZIA 3/2-2/2-3/1 MG tablet, Take 1 tablet by mouth daily., Disp: , Rfl:    sertraline (ZOLOFT) 50 MG tablet, Take 1 tablet (50 mg total) by mouth daily., Disp: 90 tablet, Rfl: 3   traZODone (DESYREL) 50 MG tablet, Take 1 tablet (50 mg total) by mouth at bedtime., Disp: 30 tablet, Rfl: 3   Vitamin D, Ergocalciferol, (DRISDOL) 50000 units CAPS capsule, Take 1 capsule (50,000 Units total) by mouth every 7 (seven) days. (Patient not taking: Reported on 08/05/2020), Disp: 4 capsule, Rfl: 0 Medication Side Effects: anxiety  Family Medical/ Social History: Changes? No  MENTAL HEALTH EXAM:  There were no vitals taken for this visit.There is no height or weight on file to calculate BMI.  General Appearance: Casual, Neat, and Well Groomed  Eye Contact:  Good  Speech:  Clear and Coherent  Volume:  Normal  Mood:  NA  Affect:  Appropriate  Thought Process:  Coherent  Orientation:  Full (Time, Place, and Person)  Thought Content: Logical   Suicidal Thoughts:  No  Homicidal Thoughts:  No  Memory:  WNL  Judgement:  Good  Insight:  Good  Psychomotor Activity:  Normal  Concentration:  Concentration: Good  Recall:  Good  Fund of Knowledge: Good  Language: Good  Assets:  Desire for Improvement  ADL's:  Intact  Cognition: WNL  Prognosis:  Good  DIAGNOSES:    ICD-10-CM   1. Attention deficit hyperactivity disorder (ADHD), predominantly inattentive type  F90.0 atomoxetine (STRATTERA) 40 MG capsule    sertraline (ZOLOFT) 50 MG tablet    2. Insomnia due to other mental disorder  F51.05 hydrOXYzine (VISTARIL) 25 MG capsule   F99 traZODone (DESYREL) 50 MG tablet    3. Seasonal allergic rhinitis due to pollen  J30.1     4. Generalized anxiety disorder  F41.1 sertraline (ZOLOFT) 50 MG tablet    5. Recurrent major depressive  disorder, in partial remission (HCC)  F33.41 sertraline (ZOLOFT) 50 MG tablet      Receiving Psychotherapy: No    RECOMMENDATIONS:   Greater than 50% of 30 min face to face time with patient was spent on counseling and coordination of care. No significant changes this visit. We discussed her recent improvements with anxiety and depression. Very happy with her medications right now. She has had completed psych testing for ADHD at her school at [facility] an it was positive for ADHD predominately inattentive type.  To Start Stratterra 40 mg daily.  Continue Zoloft 50 mg daily Continue Hydroxyzine 25 mg at bedtime for sleep Will report side effects or worsening symptoms promptly. Will follow up in 3 months to reevaluate Provided emergency contact information Reviewed PDMp    Joan Flores, NP

## 2021-08-13 ENCOUNTER — Other Ambulatory Visit (HOSPITAL_COMMUNITY)
Admission: RE | Admit: 2021-08-13 | Discharge: 2021-08-13 | Disposition: A | Discharge status: Home / Self Care | Payer: 59 | Source: Ambulatory Visit | Attending: Nurse Practitioner | Admitting: Nurse Practitioner

## 2021-08-13 ENCOUNTER — Other Ambulatory Visit: Payer: Self-pay | Admitting: Nurse Practitioner

## 2021-08-13 DIAGNOSIS — Z124 Encounter for screening for malignant neoplasm of cervix: Secondary | ICD-10-CM | POA: Insufficient documentation

## 2021-08-18 LAB — CYTOLOGY - PAP: Diagnosis: NEGATIVE

## 2021-10-10 ENCOUNTER — Telehealth (INDEPENDENT_AMBULATORY_CARE_PROVIDER_SITE_OTHER): Payer: 59 | Admitting: Behavioral Health

## 2021-10-10 ENCOUNTER — Encounter: Payer: Self-pay | Admitting: Behavioral Health

## 2021-10-10 DIAGNOSIS — F411 Generalized anxiety disorder: Secondary | ICD-10-CM | POA: Diagnosis not present

## 2021-10-10 DIAGNOSIS — F900 Attention-deficit hyperactivity disorder, predominantly inattentive type: Secondary | ICD-10-CM | POA: Diagnosis not present

## 2021-10-10 DIAGNOSIS — F410 Panic disorder [episodic paroxysmal anxiety] without agoraphobia: Secondary | ICD-10-CM | POA: Diagnosis not present

## 2021-10-10 NOTE — Progress Notes (Signed)
Maria Pena [id number] 23-Jul-2000 21 y.o.  Virtual Visit via Video Note  I connected with pt @ on 10/10/21 at  1:30 PM EDT by a video enabled telemedicine application and verified that I am speaking with the correct person using two identifiers.   I discussed the limitations of evaluation and management by telemedicine and the availability of in person appointments. The patient expressed understanding and agreed to proceed.  I discussed the assessment and treatment plan with the patient. The patient was provided an opportunity to ask questions and all were answered. The patient agreed with the plan and demonstrated an understanding of the instructions.   The patient was advised to call back or seek an in-person evaluation if the symptoms worsen or if the condition fails to improve as anticipated.  I provided 20 minutes of non-face-to-face time during this encounter.  The patient was located at home.  The provider was located at [facility].   Joan Flores, NP   Subjective:   Patient ID:  Maria Pena is a 21 y.o. (DOB 2000/03/22) female.  Chief Complaint:  Chief Complaint  Patient presents with   ADHD   Anxiety   Medication Problem   Medication Refill   Follow-up   Patient Education   Panic Attack    HPI 21 year old patient presents to this office via video visit and medication management. No significant changes since last visit. No social changes. Relationship with boyfriend still going well. She is now in her senior year at college. She did experience two panic attacks first week of school. She said they lasted about 15 min. Said she experienced shortness of breath, sweating, and increased psychomotor agitation. She practiced breathing techniques and music therapy and they subsided. She not have her hydroxyzine with her to take at the time. She is hoping this is just anxiety of getting started with school again and it being her last year.  She says that she has  been steadily improving since switching to Zoloft. Says anxiety and depression have been well controlled up until this past week.  Very happy with the Strattera. She does not want to make any medication adjustments at this time.   Reports depression a 2/10 today and anxiety at 1/10. Her sleep has improved with taking hydroxyzine when needed. She is getting 7-8 hours per night . No mania, no psychosis. No SI/HI.   Past psychotropic medications: Lexapro Hydroxyzine     Review of Systems:  Review of Systems  Constitutional: Negative.   Allergic/Immunologic: Negative.   Neurological: Negative.   Psychiatric/Behavioral:  The patient is nervous/anxious.     Medications: I have reviewed the patient's current medications.  Current Outpatient Medications  Medication Sig Dispense Refill   atomoxetine (STRATTERA) 40 MG capsule Take 1 capsule (40 mg total) by mouth daily. 30 capsule 3   Biotin w/ Vitamins C & E (HAIR/SKIN/NAILS PO) Take by mouth.     Calcium Carb-Cholecalciferol (CALCIUM/VITAMIN D PO) Take by mouth every morning.     cetirizine (ZYRTEC) 10 MG tablet Take 10 mg by mouth daily. (Patient not taking: Reported on 08/05/2020)     cycloSPORINE (SANDIMMUNE) 100 MG capsule Take 100 mg by mouth 2 (two) times daily. (Patient not taking: Reported on 08/05/2020)     EPINEPHrine 0.3 mg/0.3 mL IJ SOAJ injection Inject into the muscle.     fluticasone (FLONASE) 50 MCG/ACT nasal spray Place 2 sprays into both nostrils daily. 16 g 6   hydrOXYzine (VISTARIL) 25 MG capsule  Take 1 capsule (25 mg total) by mouth at bedtime as needed. 90 capsule 2   Multiple Vitamin (MULTIVITAMIN WITH MINERALS) TABS tablet Take 1 tablet by mouth daily.     NATAZIA 3/2-2/2-3/1 MG tablet Take 1 tablet by mouth daily.     sertraline (ZOLOFT) 50 MG tablet Take 1 tablet (50 mg total) by mouth daily. 90 tablet 3   traZODone (DESYREL) 50 MG tablet Take 1 tablet (50 mg total) by mouth at bedtime. 30 tablet 3   Vitamin D,  Ergocalciferol, (DRISDOL) 50000 units CAPS capsule Take 1 capsule (50,000 Units total) by mouth every 7 (seven) days. (Patient not taking: Reported on 08/05/2020) 4 capsule 0   No current facility-administered medications for this visit.    Medication Side Effects: None  Allergies:  Allergies  Allergen Reactions   Colchicine Other (See Comments)    Hands becomes numb from the elbows down.   Nickel    Other     Topical steroids, tea tree oil   Soy Allergy     Past Medical History:  Diagnosis Date   Allergic    Anxiety    Chest pain    Chronic idiopathic urticaria    Depression    Edema    feet and legs   Hives    unknown origin   Multiple food allergies    soy, soy sauce, shrimp    Family History  Problem Relation Age of Onset   Cancer Mother    Depression Mother    Anxiety disorder Mother    Sleep apnea Mother    Eating disorder Mother    Obesity Mother    Sleep apnea Father    Obesity Father     Social History   Socioeconomic History   Marital status: Single    Spouse name: Not on file   Number of children: Not on file   Years of education: Not on file   Highest education level: Not on file  Occupational History   Occupation: student  Tobacco Use   Smoking status: Never   Smokeless tobacco: Never  Substance and Sexual Activity   Alcohol use: No   Drug use: No   Sexual activity: Not on file  Other Topics Concern   Not on file  Social History Narrative   ** Merged History Encounter **       Social Determinants of Health   Financial Resource Strain: Not on file  Food Insecurity: Not on file  Transportation Needs: Not on file  Physical Activity: Not on file  Stress: Not on file  Social Connections: Not on file  Intimate Partner Violence: Not on file    Past Medical History, Surgical history, Social history, and Family history were reviewed and updated as appropriate.   Please see review of systems for further details on the patient's review  from today.   Objective:   Physical Exam:  There were no vitals taken for this visit.  Physical Exam Psychiatric:        Attention and Perception: Attention and perception normal.        Mood and Affect: Mood and affect normal.        Speech: Speech normal.        Behavior: Behavior normal. Behavior is cooperative.        Cognition and Memory: Cognition and memory normal.        Judgment: Judgment normal.     Lab Review:     Component Value Date/Time  NA 138 11/23/2016 1259   NA 139 08/06/2016 0756   K 4.0 11/23/2016 1259   CL 106 11/23/2016 1259   CO2 25 11/23/2016 1259   GLUCOSE 92 11/23/2016 1259   BUN 8 11/23/2016 1259   BUN 9 08/06/2016 0756   CREATININE 0.64 11/23/2016 1259   CALCIUM 9.0 11/23/2016 1259   PROT 7.1 11/23/2016 1259   PROT 6.6 08/06/2016 0756   ALBUMIN 4.2 11/23/2016 1259   ALBUMIN 4.5 08/06/2016 0756   AST 16 11/23/2016 1259   ALT 16 11/23/2016 1259   ALKPHOS 58 11/23/2016 1259   BILITOT 0.7 11/23/2016 1259   BILITOT <0.2 08/06/2016 0756   GFRNONAA NOT CALCULATED 11/23/2016 1259   GFRAA NOT CALCULATED 11/23/2016 1259       Component Value Date/Time   WBC 6.0 11/23/2016 1259   RBC 4.45 11/23/2016 1259   HGB 12.6 11/23/2016 1259   HGB 13.5 04/01/2016 1004   HCT 36.3 11/23/2016 1259   HCT 40.5 04/01/2016 1004   PLT 290 11/23/2016 1259   MCV 81.6 11/23/2016 1259   MCV 83 04/01/2016 1004   MCH 28.3 11/23/2016 1259   MCHC 34.7 11/23/2016 1259   RDW 12.3 11/23/2016 1259   RDW 13.3 04/01/2016 1004   LYMPHSABS 2.2 11/23/2016 1259   LYMPHSABS 2.4 04/01/2016 1004   MONOABS 0.5 11/23/2016 1259   EOSABS 0.3 11/23/2016 1259   EOSABS 0.1 04/01/2016 1004   BASOSABS 0.0 11/23/2016 1259   BASOSABS 0.0 04/01/2016 1004    No results found for: "POCLITH", "LITHIUM"   No results found for: "PHENYTOIN", "PHENOBARB", "VALPROATE", "CBMZ"   .res Assessment: Plan:    RECOMMENDATIONS:    Greater than 50% of 30 min face to face time with patient  was spent on counseling and coordination of care. No significant changes this visit. We discussed her recent improvements with anxiety and depression. Very happy with her medications right now.  She did experience two panic last week, the first week of school. She does not understand any known triggers. She is hoping that it was just temporary from anxiety about her last year of college. She would like to monitor closely but does not want to make any medication changes at this time.  To Start Stratterra 40 mg daily.  Continue Zoloft 50 mg daily Continue Hydroxyzine 25 mg as needed for anxiety, panic.  Will report side effects or worsening symptoms promptly. Will follow up in 3 months to reevaluate Provided emergency contact information Reviewed PDMp       Joan Flores, NP           Marbella was seen today for adhd, anxiety, medication problem, medication refill, follow-up, patient education and panic attack.  Diagnoses and all orders for this visit:  Generalized anxiety disorder  Attention deficit hyperactivity disorder (ADHD), predominantly inattentive type  Panic attack     Please see After Visit Summary for patient specific instructions.  Future Appointments  Date Time Provider Department Center  01/26/2022 11:00 AM Avelina Laine A, NP [facility] None    No orders of the defined types were placed in this encounter.     -------------------------------

## 2022-01-26 ENCOUNTER — Encounter: Payer: Self-pay | Admitting: Behavioral Health

## 2022-01-26 ENCOUNTER — Ambulatory Visit (INDEPENDENT_AMBULATORY_CARE_PROVIDER_SITE_OTHER): Payer: 59 | Admitting: Behavioral Health

## 2022-01-26 DIAGNOSIS — F411 Generalized anxiety disorder: Secondary | ICD-10-CM

## 2022-01-26 DIAGNOSIS — F3341 Major depressive disorder, recurrent, in partial remission: Secondary | ICD-10-CM

## 2022-01-26 DIAGNOSIS — F900 Attention-deficit hyperactivity disorder, predominantly inattentive type: Secondary | ICD-10-CM

## 2022-01-26 DIAGNOSIS — F99 Mental disorder, not otherwise specified: Secondary | ICD-10-CM

## 2022-01-26 DIAGNOSIS — F5105 Insomnia due to other mental disorder: Secondary | ICD-10-CM | POA: Diagnosis not present

## 2022-01-26 MED ORDER — ATOMOXETINE HCL 40 MG PO CAPS: 40.0000 mg | ORAL_CAPSULE | Freq: Every day | ORAL | 3 refills | Status: DC

## 2022-01-26 MED ORDER — SERTRALINE HCL 50 MG PO TABS: 50.0000 mg | ORAL_TABLET | Freq: Every day | ORAL | 3 refills | Status: DC

## 2022-01-26 MED ORDER — TRAZODONE HCL 50 MG PO TABS: 50.0000 mg | ORAL_TABLET | Freq: Every day | ORAL | 3 refills | Status: DC

## 2022-01-26 NOTE — Progress Notes (Signed)
[facility]  Patient ID: Maria Pena,  MRN: [id number]  PCP: Aliene Beams, MD  Date of Evaluation: 01/26/2022 Time spent:30 minutes  Chief Complaint:  Chief Complaint   Anxiety; ADHD; Depression; Follow-up; Medication Refill; Patient Education     HISTORY/CURRENT STATUS: HPI  21 year old patient presents to this office for follow up visit and medication management. No significant changes since last visit. No social changes. Relationship with boyfriend still going well. She is now in her senior year at college. She has only experienced one panic attack this semester in school. She practiced breathing techniques and music therapy and they subsided. Not taking hydroxyzine on regular basis. She says that she has been steadily improving since switching to Zoloft. Says anxiety and depression have been well controlled overall.  Very happy with the Strattera. She does not want to make any medication adjustments at this time.   Reports depression a 2/10 today and anxiety at 1/10. Her sleep has improved with taking hydroxyzine when needed. She is getting 7-8 hours per night . No mania, no psychosis. No SI/HI.   Past psychotropic medications: Lexapro Hydroxyzine      Individual Medical History/ Review of Systems: Changes? :No   Allergies: Colchicine, Nickel, Other, and Soy allergy  Current Medications:  Current Outpatient Medications:    norgestimate-ethinyl estradiol (ORTHO-CYCLEN) 0.25-35 MG-MCG tablet, Take 1 tablet by mouth daily., Disp: , Rfl:    atomoxetine (STRATTERA) 40 MG capsule, Take 1 capsule (40 mg total) by mouth daily., Disp: 30 capsule, Rfl: 3   Biotin w/ Vitamins C & E (HAIR/SKIN/NAILS PO), Take by mouth., Disp: , Rfl:    Calcium Carb-Cholecalciferol (CALCIUM/VITAMIN D PO), Take by mouth every morning., Disp: , Rfl:    cetirizine (ZYRTEC) 10 MG tablet, Take 10 mg by mouth daily. (Patient not taking: Reported on 08/05/2020), Disp: , Rfl:    cycloSPORINE  (SANDIMMUNE) 100 MG capsule, Take 100 mg by mouth 2 (two) times daily. (Patient not taking: Reported on 08/05/2020), Disp: , Rfl:    EPINEPHrine 0.3 mg/0.3 mL IJ SOAJ injection, Inject into the muscle., Disp: , Rfl:    fluticasone (FLONASE) 50 MCG/ACT nasal spray, Place 2 sprays into both nostrils daily., Disp: 16 g, Rfl: 6   Multiple Vitamin (MULTIVITAMIN WITH MINERALS) TABS tablet, Take 1 tablet by mouth daily., Disp: , Rfl:    NATAZIA 3/2-2/2-3/1 MG tablet, Take 1 tablet by mouth daily., Disp: , Rfl:    sertraline (ZOLOFT) 50 MG tablet, Take 1 tablet (50 mg total) by mouth daily., Disp: 90 tablet, Rfl: 3   traZODone (DESYREL) 50 MG tablet, Take 1 tablet (50 mg total) by mouth at bedtime., Disp: 30 tablet, Rfl: 3   Vitamin D, Ergocalciferol, (DRISDOL) 50000 units CAPS capsule, Take 1 capsule (50,000 Units total) by mouth every 7 (seven) days. (Patient not taking: Reported on 08/05/2020), Disp: 4 capsule, Rfl: 0 Medication Side Effects: none  Family Medical/ Social History: Changes? No  MENTAL HEALTH EXAM:  There were no vitals taken for this visit.There is no height or weight on file to calculate BMI.  General Appearance: Casual, Neat, and Well Groomed  Eye Contact:  Good  Speech:  Clear and Coherent  Volume:  Normal  Mood:  NA  Affect:  Appropriate  Thought Process:  Coherent  Orientation:  Full (Time, Place, and Person)  Thought Content: Logical   Suicidal Thoughts:  No  Homicidal Thoughts:  No  Memory:  WNL  Judgement:  Good  Insight:  Good  Psychomotor Activity:  Normal  Concentration:  Concentration: Good  Recall:  Good  Fund of Knowledge: Good  Language: Good  Assets:  Desire for Improvement  ADL's:  Intact  Cognition: WNL  Prognosis:  Good    DIAGNOSES:    ICD-10-CM   1. Attention deficit hyperactivity disorder (ADHD), predominantly inattentive type  F90.0 atomoxetine (STRATTERA) 40 MG capsule    sertraline (ZOLOFT) 50 MG tablet    2. Generalized anxiety disorder   F41.1 sertraline (ZOLOFT) 50 MG tablet    3. Recurrent major depressive disorder, in partial remission (HCC)  F33.41 sertraline (ZOLOFT) 50 MG tablet    4. Insomnia due to other mental disorder  F51.05 traZODone (DESYREL) 50 MG tablet   F99       Receiving Psychotherapy: No    RECOMMENDATIONS:  Greater than 50% of 30 min face to face time with patient was spent on counseling and coordination of care. No significant changes this visit. We discussed her recent improvements with anxiety and depression. Very happy with her medications right now.  She did experience one recent panic attack. She does not understand any known triggers.  She would like to monitor closely but does not want to make any medication changes at this time.  Continue Stratterra 40 mg daily.  Continue Zoloft 50 mg daily Continue Hydroxyzine 25 mg as needed for anxiety, panic.  Will report side effects or worsening symptoms promptly. Will follow up in 6 months to reevaluate Provided emergency contact information Reviewed PDMp    Joan Flores, NP

## 2022-02-09 ENCOUNTER — Other Ambulatory Visit: Payer: Self-pay | Admitting: Behavioral Health

## 2022-02-09 DIAGNOSIS — F5105 Insomnia due to other mental disorder: Secondary | ICD-10-CM

## 2022-02-10 ENCOUNTER — Other Ambulatory Visit: Payer: Self-pay | Admitting: Behavioral Health

## 2022-02-10 DIAGNOSIS — F900 Attention-deficit hyperactivity disorder, predominantly inattentive type: Secondary | ICD-10-CM

## 2022-07-02 ENCOUNTER — Telehealth (INDEPENDENT_AMBULATORY_CARE_PROVIDER_SITE_OTHER): Payer: 59 | Admitting: Behavioral Health

## 2022-07-02 ENCOUNTER — Encounter: Payer: Self-pay | Admitting: Behavioral Health

## 2022-07-02 DIAGNOSIS — F900 Attention-deficit hyperactivity disorder, predominantly inattentive type: Secondary | ICD-10-CM

## 2022-07-02 DIAGNOSIS — F3341 Major depressive disorder, recurrent, in partial remission: Secondary | ICD-10-CM | POA: Diagnosis not present

## 2022-07-02 DIAGNOSIS — F411 Generalized anxiety disorder: Secondary | ICD-10-CM

## 2022-07-02 NOTE — Progress Notes (Signed)
Maria Pena [id number] 02-01-2001 22 y.o.  Virtual Visit via Video Note  I connected with pt @ on 07/02/22 at  9:30 AM EDT by a video enabled telemedicine application and verified that I am speaking with the correct person using two identifiers.   I discussed the limitations of evaluation and management by telemedicine and the availability of in person appointments. The patient expressed understanding and agreed to proceed.  I discussed the assessment and treatment plan with the patient. The patient was provided an opportunity to ask questions and all were answered. The patient agreed with the plan and demonstrated an understanding of the instructions.   The patient was advised to call back or seek an in-person evaluation if the symptoms worsen or if the condition fails to improve as anticipated.  I provided 20 minutes of non-face-to-face time during this encounter.  The patient was located at home.  The provider was located at [facility].   Joan Flores, NP   Subjective:   Patient ID:  Maria Pena is a 22 y.o. (DOB 14-Feb-2000) female.  Chief Complaint:  Chief Complaint  Patient presents with   Anxiety   Depression   ADHD   Follow-up   Medication Refill   Patient Education    HPI 22 year old patient presents to this office for follow up visit and medication management. No significant changes since last visit. No social changes. Relationship with boyfriend still going well. Just finished up her bachelors degree.  Will be starting masters in the fall at [employer]. Not taking hydroxyzine on regular basis.  Says anxiety and depression have been well controlled overall.  Very happy with the Strattera. She does not want to make any medication adjustments at this time.   Reports depression a 2/10 today and anxiety at 1/10. Her sleep has improved with taking hydroxyzine when needed. She is getting 7-8 hours per night . No mania, no psychosis. No SI/HI.   Past psychotropic  medications: Lexapro Hydroxyzine             Review of Systems:  Review of Systems  Constitutional: Negative.   Allergic/Immunologic: Negative.   Neurological: Negative.   Psychiatric/Behavioral: Negative.      Medications: I have reviewed the patient's current medications.  Current Outpatient Medications  Medication Sig Dispense Refill   atomoxetine (STRATTERA) 40 MG capsule TAKE 1 CAPSULE (40 MG TOTAL) BY MOUTH DAILY. 90 capsule 1   Biotin w/ Vitamins C & E (HAIR/SKIN/NAILS PO) Take by mouth.     Calcium Carb-Cholecalciferol (CALCIUM/VITAMIN D PO) Take by mouth every morning.     cetirizine (ZYRTEC) 10 MG tablet Take 10 mg by mouth daily. (Patient not taking: Reported on 08/05/2020)     cycloSPORINE (SANDIMMUNE) 100 MG capsule Take 100 mg by mouth 2 (two) times daily. (Patient not taking: Reported on 08/05/2020)     EPINEPHrine 0.3 mg/0.3 mL IJ SOAJ injection Inject into the muscle.     fluticasone (FLONASE) 50 MCG/ACT nasal spray Place 2 sprays into both nostrils daily. 16 g 6   Multiple Vitamin (MULTIVITAMIN WITH MINERALS) TABS tablet Take 1 tablet by mouth daily.     NATAZIA 3/2-2/2-3/1 MG tablet Take 1 tablet by mouth daily.     norgestimate-ethinyl estradiol (ORTHO-CYCLEN) 0.25-35 MG-MCG tablet Take 1 tablet by mouth daily.     sertraline (ZOLOFT) 50 MG tablet Take 1 tablet (50 mg total) by mouth daily. 90 tablet 3   traZODone (DESYREL) 50 MG tablet TAKE 1 TABLET BY MOUTH  EVERYDAY AT BEDTIME 90 tablet 2   Vitamin D, Ergocalciferol, (DRISDOL) 50000 units CAPS capsule Take 1 capsule (50,000 Units total) by mouth every 7 (seven) days. (Patient not taking: Reported on 08/05/2020) 4 capsule 0   No current facility-administered medications for this visit.    Medication Side Effects: None  Allergies:  Allergies  Allergen Reactions   Colchicine Other (See Comments)    Hands becomes numb from the elbows down.   Nickel    Other     Topical steroids, tea tree oil   Soy  Allergy     Past Medical History:  Diagnosis Date   Allergic    Anxiety    Chest pain    Chronic idiopathic urticaria    Depression    Edema    feet and legs   Hives    unknown origin   Multiple food allergies    soy, soy sauce, shrimp    Family History  Problem Relation Age of Onset   Cancer Mother    Depression Mother    Anxiety disorder Mother    Sleep apnea Mother    Eating disorder Mother    Obesity Mother    Sleep apnea Father    Obesity Father     Social History   Socioeconomic History   Marital status: Single    Spouse name: Not on file   Number of children: Not on file   Years of education: Not on file   Highest education level: Not on file  Occupational History   Occupation: student  Tobacco Use   Smoking status: Never   Smokeless tobacco: Never  Substance and Sexual Activity   Alcohol use: No   Drug use: No   Sexual activity: Not on file  Other Topics Concern   Not on file  Social History Narrative   ** Merged History Encounter **       Social Determinants of Health   Financial Resource Strain: Not on file  Food Insecurity: Not on file  Transportation Needs: Not on file  Physical Activity: Not on file  Stress: Not on file  Social Connections: Not on file  Intimate Partner Violence: Not on file    Past Medical History, Surgical history, Social history, and Family history were reviewed and updated as appropriate.   Please see review of systems for further details on the patient's review from today.   Objective:   Physical Exam:  There were no vitals taken for this visit.  Physical Exam Neurological:     Mental Status: She is alert and oriented to person, place, and time.  Psychiatric:        Attention and Perception: Attention and perception normal.        Mood and Affect: Mood normal.        Speech: Speech normal.        Behavior: Behavior normal. Behavior is cooperative.        Cognition and Memory: Cognition and memory  normal.        Judgment: Judgment normal.     Comments: Insight intact     Lab Review:     Component Value Date/Time   NA 138 11/23/2016 1259   NA 139 08/06/2016 0756   K 4.0 11/23/2016 1259   CL 106 11/23/2016 1259   CO2 25 11/23/2016 1259   GLUCOSE 92 11/23/2016 1259   BUN 8 11/23/2016 1259   BUN 9 08/06/2016 0756   CREATININE 0.64 11/23/2016 1259   CALCIUM  9.0 11/23/2016 1259   PROT 7.1 11/23/2016 1259   PROT 6.6 08/06/2016 0756   ALBUMIN 4.2 11/23/2016 1259   ALBUMIN 4.5 08/06/2016 0756   AST 16 11/23/2016 1259   ALT 16 11/23/2016 1259   ALKPHOS 58 11/23/2016 1259   BILITOT 0.7 11/23/2016 1259   BILITOT <0.2 08/06/2016 0756   GFRNONAA NOT CALCULATED 11/23/2016 1259   GFRAA NOT CALCULATED 11/23/2016 1259       Component Value Date/Time   WBC 6.0 11/23/2016 1259   RBC 4.45 11/23/2016 1259   HGB 12.6 11/23/2016 1259   HGB 13.5 04/01/2016 1004   HCT 36.3 11/23/2016 1259   HCT 40.5 04/01/2016 1004   PLT 290 11/23/2016 1259   MCV 81.6 11/23/2016 1259   MCV 83 04/01/2016 1004   MCH 28.3 11/23/2016 1259   MCHC 34.7 11/23/2016 1259   RDW 12.3 11/23/2016 1259   RDW 13.3 04/01/2016 1004   LYMPHSABS 2.2 11/23/2016 1259   LYMPHSABS 2.4 04/01/2016 1004   MONOABS 0.5 11/23/2016 1259   EOSABS 0.3 11/23/2016 1259   EOSABS 0.1 04/01/2016 1004   BASOSABS 0.0 11/23/2016 1259   BASOSABS 0.0 04/01/2016 1004    No results found for: "POCLITH", "LITHIUM"   No results found for: "PHENYTOIN", "PHENOBARB", "VALPROATE", "CBMZ"   .res Assessment: Plan:    Greater than 50% of 30 min face to face time with patient was spent on counseling and coordination of care. No significant changes this visit. We discussed her recent improvements with anxiety and depression. Very happy with her medications right now.  She did experience one recent panic attack. She does not understand any known triggers.  Requesting no medication changes at this time Continue Stratterra 40 mg daily.   Continue Zoloft 50 mg daily Continue Hydroxyzine 25 mg as needed for anxiety, panic.  Will report side effects or worsening symptoms promptly. Will follow up in 6 months to reevaluate Provided emergency contact information Reviewed PDMP  [name] A. Ilissa Rosner, NP     There are no diagnoses linked to this encounter.   Please see After Visit Summary for patient specific instructions.  Future Appointments  Date Time Provider Department Center  11/06/2022  3:30 PM Avelina Laine A, NP [facility] None    No orders of the defined types were placed in this encounter.     -------------------------------

## 2022-11-06 ENCOUNTER — Ambulatory Visit: Payer: 59 | Admitting: Behavioral Health

## 2022-12-04 ENCOUNTER — Ambulatory Visit: Payer: 59 | Admitting: Behavioral Health

## 2022-12-04 ENCOUNTER — Encounter: Payer: Self-pay | Admitting: Behavioral Health

## 2022-12-04 DIAGNOSIS — F411 Generalized anxiety disorder: Secondary | ICD-10-CM | POA: Diagnosis not present

## 2022-12-04 DIAGNOSIS — F900 Attention-deficit hyperactivity disorder, predominantly inattentive type: Secondary | ICD-10-CM | POA: Diagnosis not present

## 2022-12-04 DIAGNOSIS — F3341 Major depressive disorder, recurrent, in partial remission: Secondary | ICD-10-CM | POA: Diagnosis not present

## 2022-12-04 DIAGNOSIS — F5105 Insomnia due to other mental disorder: Secondary | ICD-10-CM | POA: Diagnosis not present

## 2022-12-04 DIAGNOSIS — F99 Mental disorder, not otherwise specified: Secondary | ICD-10-CM

## 2022-12-04 MED ORDER — TRAZODONE HCL 50 MG PO TABS: 50.0000 mg | ORAL_TABLET | Freq: Every day | ORAL | 1 refills | Status: DC

## 2022-12-04 MED ORDER — ATOMOXETINE HCL 40 MG PO CAPS: 40.0000 mg | ORAL_CAPSULE | Freq: Every day | ORAL | 1 refills | Status: DC

## 2022-12-04 MED ORDER — SERTRALINE HCL 50 MG PO TABS: 50.0000 mg | ORAL_TABLET | Freq: Every day | ORAL | 1 refills | Status: DC

## 2022-12-04 NOTE — Progress Notes (Signed)
[facility]  Patient ID: Maria Pena,  MRN: [id number]  PCP: Aliene Beams, MD  Date of Evaluation: 12/04/2022 Time spent:30 minutes  Chief Complaint:  Chief Complaint   Anxiety; ADHD; Depression; Follow-up; Medication Refill; Patient Education     HISTORY/CURRENT STATUS: HPI 22 year old patient presents to this office for follow up visit and medication management. No significant changes since last visit.  Broke up with boyfriend of one year this past Sunday.   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 Not taking hydroxyzine on regular basis.  Says anxiety and depression have been well controlled overall.  Very happy with the Strattera. She does not want to make any medication adjustments at this time.   Reports depression a 2/10 today and anxiety at 1/10. Her sleep has improved with taking hydroxyzine when needed. She is getting 7-8 hours per night . No mania, no psychosis. No SI/HI.   Past psychotropic medications: Lexapro Hydroxyzine       Individual Medical History/ Review of Systems: Changes? :No   Allergies: Colchicine, Nickel, Other, and Soy allergy  Current Medications:  Current Outpatient Medications:    atomoxetine (STRATTERA) 40 MG capsule, Take 1 capsule (40 mg total) by mouth daily., Disp: 90 capsule, Rfl: 1   Biotin w/ Vitamins C & E (HAIR/SKIN/NAILS PO), Take by mouth., Disp: , Rfl:    Calcium Carb-Cholecalciferol (CALCIUM/VITAMIN D PO), Take by mouth every morning., Disp: , Rfl:    cetirizine (ZYRTEC) 10 MG tablet, Take 10 mg by mouth daily. (Patient not taking: Reported on 08/05/2020), Disp: , Rfl:    cycloSPORINE (SANDIMMUNE) 100 MG capsule, Take 100 mg by mouth 2 (two) times daily. (Patient not taking: Reported on 08/05/2020), Disp: , Rfl:    EPINEPHrine 0.3 mg/0.3 mL IJ SOAJ injection, Inject into the muscle., Disp: , Rfl:    fluticasone (FLONASE) 50 MCG/ACT nasal spray, Place 2 sprays into both nostrils daily., Disp:  16 g, Rfl: 6   Multiple Vitamin (MULTIVITAMIN WITH MINERALS) TABS tablet, Take 1 tablet by mouth daily., Disp: , Rfl:    NATAZIA 3/2-2/2-3/1 MG tablet, Take 1 tablet by mouth daily., Disp: , Rfl:    norgestimate-ethinyl estradiol (ORTHO-CYCLEN) 0.25-35 MG-MCG tablet, Take 1 tablet by mouth daily., Disp: , Rfl:    sertraline (ZOLOFT) 50 MG tablet, Take 1 tablet (50 mg total) by mouth daily., Disp: 90 tablet, Rfl: 1   traZODone (DESYREL) 50 MG tablet, Take 1 tablet (50 mg total) by mouth at bedtime., Disp: 90 tablet, Rfl: 1   Vitamin D, Ergocalciferol, (DRISDOL) 50000 units CAPS capsule, Take 1 capsule (50,000 Units total) by mouth every 7 (seven) days. (Patient not taking: Reported on 08/05/2020), Disp: 4 capsule, Rfl: 0 Medication Side Effects: none  Family Medical/ Social History: Changes? No  MENTAL HEALTH EXAM:  There were no vitals taken for this visit.There is no height or weight on file to calculate BMI.  General Appearance: Casual, Neat, and Well Groomed  Eye Contact:  Good  Speech:  Clear and Coherent  Volume:  Normal  Mood:  NA  Affect:  Appropriate  Thought Process:  Coherent  Orientation:  Full (Time, Place, and Person)  Thought Content: Logical   Suicidal Thoughts:  No  Homicidal Thoughts:  No  Memory:  WNL  Judgement:  Good  Insight:  Good  Psychomotor Activity:  Normal  Concentration:  Concentration: Good  Recall:  Good  Fund of Knowledge: Good  Language: Good  Assets:  Desire  for Improvement  ADL's:  Intact  Cognition: WNL  Prognosis:  Good    DIAGNOSES:    ICD-10-CM   1. Attention deficit hyperactivity disorder (ADHD), predominantly inattentive type  F90.0 atomoxetine (STRATTERA) 40 MG capsule    sertraline (ZOLOFT) 50 MG tablet    2. Generalized anxiety disorder  F41.1 sertraline (ZOLOFT) 50 MG tablet    3. Recurrent major depressive disorder, in partial remission (HCC)  F33.41 sertraline (ZOLOFT) 50 MG tablet    4. Insomnia due to other mental disorder   F51.05 traZODone (DESYREL) 50 MG tablet   F99       Receiving Psychotherapy: No    RECOMMENDATIONS:  Greater than 50% of 30 min face to face time with patient was spent on counseling and coordination of care. No significant changes this visit. We discussed her recent improvements with anxiety and depression. Very happy with her medications right now.  She did experience one recent panic attack. She does not understand any known triggers.  Requesting no medication changes at this time Continue Stratterra 40 mg daily.  Continue Zoloft 50 mg daily Continue Hydroxyzine 25 mg as needed for anxiety, panic.  Will report side effects or worsening symptoms promptly. Will follow up in 6 months to reevaluate Provided emergency contact information Reviewed PDMP   [name] A. Cliffton Asters, NP      Joan Flores, NP

## 2022-12-23 ENCOUNTER — Encounter: Payer: Self-pay | Admitting: Psychiatry

## 2023-04-09 ENCOUNTER — Ambulatory Visit: Payer: 59 | Admitting: Behavioral Health

## 2023-04-19 ENCOUNTER — Encounter: Payer: Self-pay | Admitting: Behavioral Health

## 2023-04-19 ENCOUNTER — Ambulatory Visit: Payer: 59 | Admitting: Behavioral Health

## 2023-04-19 DIAGNOSIS — F411 Generalized anxiety disorder: Secondary | ICD-10-CM | POA: Diagnosis not present

## 2023-04-19 DIAGNOSIS — F5105 Insomnia due to other mental disorder: Secondary | ICD-10-CM | POA: Diagnosis not present

## 2023-04-19 DIAGNOSIS — F900 Attention-deficit hyperactivity disorder, predominantly inattentive type: Secondary | ICD-10-CM | POA: Diagnosis not present

## 2023-04-19 DIAGNOSIS — F3341 Major depressive disorder, recurrent, in partial remission: Secondary | ICD-10-CM

## 2023-04-19 DIAGNOSIS — F99 Mental disorder, not otherwise specified: Secondary | ICD-10-CM

## 2023-04-19 MED ORDER — SERTRALINE HCL 50 MG PO TABS: 50.0000 mg | ORAL_TABLET | Freq: Every day | ORAL | 1 refills | Status: DC

## 2023-04-19 MED ORDER — ATOMOXETINE HCL 40 MG PO CAPS: 40.0000 mg | ORAL_CAPSULE | Freq: Every day | ORAL | 1 refills | Status: DC

## 2023-04-19 MED ORDER — TRAZODONE HCL 50 MG PO TABS: 50.0000 mg | ORAL_TABLET | Freq: Every day | ORAL | 1 refills | Status: DC

## 2023-04-19 NOTE — Progress Notes (Signed)
 [facility]  Patient ID: Maria Pena,  MRN: [id number]  PCP: Aliene Beams, MD  Date of Evaluation: 04/19/2023 Time spent:30 minutes  Chief Complaint:  Chief Complaint   Anxiety; Depression; Follow-up; ADHD; Patient Education; Medication Refill     HISTORY/CURRENT STATUS: HPI  23 year old patient presents to this office for follow up visit and medication management. No significant changes since last visit.  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 Not taking hydroxyzine on regular basis.  Says anxiety and depression have been well controlled overall.  Very happy with the Strattera. She does not want to make any medication adjustments at this time.   Reports depression a 2/10 today and anxiety at 1/10.  She is getting 7-8 hours per night . No mania, no psychosis. No SI/HI.   Past psychotropic medications: Lexapro Hydroxyzine    Individual Medical History/ Review of Systems: Changes? :No   Allergies: Colchicine, Nickel, Other, and Soy allergy (obsolete)  Current Medications:  Current Outpatient Medications:    atomoxetine (STRATTERA) 40 MG capsule, Take 1 capsule (40 mg total) by mouth daily., Disp: 90 capsule, Rfl: 1   Biotin w/ Vitamins C & E (HAIR/SKIN/NAILS PO), Take by mouth., Disp: , Rfl:    Calcium Carb-Cholecalciferol (CALCIUM/VITAMIN D PO), Take by mouth every morning., Disp: , Rfl:    cetirizine (ZYRTEC) 10 MG tablet, Take 10 mg by mouth daily. (Patient not taking: Reported on 08/05/2020), Disp: , Rfl:    cycloSPORINE (SANDIMMUNE) 100 MG capsule, Take 100 mg by mouth 2 (two) times daily. (Patient not taking: Reported on 08/05/2020), Disp: , Rfl:    EPINEPHrine 0.3 mg/0.3 mL IJ SOAJ injection, Inject into the muscle., Disp: , Rfl:    fluticasone (FLONASE) 50 MCG/ACT nasal spray, Place 2 sprays into both nostrils daily., Disp: 16 g, Rfl: 6   Multiple Vitamin (MULTIVITAMIN WITH MINERALS) TABS tablet, Take 1 tablet by mouth daily.,  Disp: , Rfl:    NATAZIA 3/2-2/2-3/1 MG tablet, Take 1 tablet by mouth daily., Disp: , Rfl:    norgestimate-ethinyl estradiol (ORTHO-CYCLEN) 0.25-35 MG-MCG tablet, Take 1 tablet by mouth daily., Disp: , Rfl:    sertraline (ZOLOFT) 50 MG tablet, Take 1 tablet (50 mg total) by mouth daily., Disp: 90 tablet, Rfl: 1   traZODone (DESYREL) 50 MG tablet, Take 1 tablet (50 mg total) by mouth at bedtime., Disp: 90 tablet, Rfl: 1   Vitamin D, Ergocalciferol, (DRISDOL) 50000 units CAPS capsule, Take 1 capsule (50,000 Units total) by mouth every 7 (seven) days. (Patient not taking: Reported on 08/05/2020), Disp: 4 capsule, Rfl: 0 Medication Side Effects: none  Family Medical/ Social History: Changes? No  MENTAL HEALTH EXAM:  Pulse 84, weight 234 lb (106.1 kg).Body mass index is 38.94 kg/m.  General Appearance: Casual  Eye Contact:  NA  Speech:  Clear and Coherent  Volume:  Normal  Mood:  NA  Affect:  Appropriate  Thought Process:  Coherent  Orientation:  Full (Time, Place, and Person)  Thought Content: Logical   Suicidal Thoughts:  No  Homicidal Thoughts:  No  Memory:  WNL  Judgement:  Good  Insight:  Good  Psychomotor Activity:  Normal  Concentration:  Concentration: Good  Recall:  Good  Fund of Knowledge: Good  Language: Good  Assets:  Desire for Improvement  ADL's:  Intact  Cognition: WNL  Prognosis:  Good    DIAGNOSES:    ICD-10-CM   1. Attention deficit hyperactivity disorder (ADHD), predominantly inattentive  type  F90.0 sertraline (ZOLOFT) 50 MG tablet    atomoxetine (STRATTERA) 40 MG capsule    2. Generalized anxiety disorder  F41.1 sertraline (ZOLOFT) 50 MG tablet    3. Recurrent major depressive disorder, in partial remission (HCC)  F33.41 sertraline (ZOLOFT) 50 MG tablet    4. Insomnia due to other mental disorder  F51.05 traZODone (DESYREL) 50 MG tablet   F99       Receiving Psychotherapy: No    RECOMMENDATIONS:   Greater than 50% of 30 min face to face time  with patient was spent on counseling and coordination of care. No significant changes this visit. We discussed her recent improvements with anxiety and depression. Very happy with her medications right now.    Requesting no medication changes at this time Continue Stratterra 40 mg daily.  Continue Zoloft 50 mg daily Continue Hydroxyzine 25 mg as needed for anxiety, panic.  Will report side effects or worsening symptoms promptly. Will follow up in 3 months to reevaluate Provided emergency contact information Currently on [facility]. Having sporadic periods Reviewed PDMP   [name] A. Anne-Marie Genson, NP

## 2023-07-20 ENCOUNTER — Encounter: Payer: Self-pay | Admitting: Behavioral Health

## 2023-07-20 ENCOUNTER — Ambulatory Visit: Admitting: Behavioral Health

## 2023-07-20 DIAGNOSIS — F99 Mental disorder, not otherwise specified: Secondary | ICD-10-CM

## 2023-07-20 DIAGNOSIS — F3341 Major depressive disorder, recurrent, in partial remission: Secondary | ICD-10-CM

## 2023-07-20 DIAGNOSIS — F5105 Insomnia due to other mental disorder: Secondary | ICD-10-CM | POA: Diagnosis not present

## 2023-07-20 DIAGNOSIS — F900 Attention-deficit hyperactivity disorder, predominantly inattentive type: Secondary | ICD-10-CM | POA: Diagnosis not present

## 2023-07-20 DIAGNOSIS — F411 Generalized anxiety disorder: Secondary | ICD-10-CM | POA: Diagnosis not present

## 2023-07-20 MED ORDER — ATOMOXETINE HCL 40 MG PO CAPS: 40.0000 mg | ORAL_CAPSULE | Freq: Every day | ORAL | 1 refills | Status: DC

## 2023-07-20 MED ORDER — SERTRALINE HCL 50 MG PO TABS: 50.0000 mg | ORAL_TABLET | Freq: Every day | ORAL | 1 refills | Status: DC

## 2023-07-20 MED ORDER — TRAZODONE HCL 50 MG PO TABS: 50.0000 mg | ORAL_TABLET | Freq: Every day | ORAL | 1 refills | Status: DC

## 2023-07-20 NOTE — Progress Notes (Signed)
 [facility]  Patient ID: Maria Pena,  MRN: [id number]  PCP: Dorena Gander, MD  Date of Evaluation: 07/20/2023 Time spent:30 minutes  Chief Complaint:  Chief Complaint   Depression; Anxiety; Follow-up; Medication Refill; Patient Education     HISTORY/CURRENT STATUS: HPI 23 year old patient presents to this office for follow up visit and medication management. No significant changes since last visit.  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 Living back with parents temporarily. Not taking hydroxyzine  on regular basis.  Says anxiety and depression have been well controlled overall.  Very happy with the Strattera . She does not want to make any medication adjustments at this time.   Reports depression a 1/10 today and anxiety at 1/10.  She is getting 7-8 hours per night . No mania, no psychosis. No SI/HI.   Past psychotropic medications: Lexapro Hydroxyzine      Individual Medical History/ Review of Systems: Changes? :No   Allergies: Colchicine, Nickel, Other, and Soy allergy (obsolete)  Current Medications:  Current Outpatient Medications:    atomoxetine  (STRATTERA ) 40 MG capsule, Take 1 capsule (40 mg total) by mouth daily., Disp: 90 capsule, Rfl: 1   Biotin w/ Vitamins C & E (HAIR/SKIN/NAILS PO), Take by mouth., Disp: , Rfl:    Calcium Carb-Cholecalciferol (CALCIUM/VITAMIN D  PO), Take by mouth every morning., Disp: , Rfl:    cetirizine (ZYRTEC) 10 MG tablet, Take 10 mg by mouth daily. (Patient not taking: Reported on 08/05/2020), Disp: , Rfl:    cycloSPORINE (SANDIMMUNE) 100 MG capsule, Take 100 mg by mouth 2 (two) times daily. (Patient not taking: Reported on 08/05/2020), Disp: , Rfl:    EPINEPHrine  0.3 mg/0.3 mL IJ SOAJ injection, Inject into the muscle., Disp: , Rfl:    fluticasone  (FLONASE ) 50 MCG/ACT nasal spray, Place 2 sprays into both nostrils daily., Disp: 16 g, Rfl: 6   Multiple Vitamin (MULTIVITAMIN WITH MINERALS) TABS tablet, Take 1 tablet  by mouth daily., Disp: , Rfl:    NATAZIA 3/2-2/2-3/1 MG tablet, Take 1 tablet by mouth daily., Disp: , Rfl:    norgestimate-ethinyl estradiol (ORTHO-CYCLEN) 0.25-35 MG-MCG tablet, Take 1 tablet by mouth daily., Disp: , Rfl:    sertraline  (ZOLOFT ) 50 MG tablet, Take 1 tablet (50 mg total) by mouth daily., Disp: 90 tablet, Rfl: 1   traZODone  (DESYREL ) 50 MG tablet, Take 1 tablet (50 mg total) by mouth at bedtime., Disp: 90 tablet, Rfl: 1   Vitamin D , Ergocalciferol , (DRISDOL ) 50000 units CAPS capsule, Take 1 capsule (50,000 Units total) by mouth every 7 (seven) days. (Patient not taking: Reported on 08/05/2020), Disp: 4 capsule, Rfl: 0 Medication Side Effects: none  Family Medical/ Social History: Changes? No  MENTAL HEALTH EXAM:  There were no vitals taken for this visit.There is no height or weight on file to calculate BMI.  General Appearance: Casual  Eye Contact:  Good  Speech:  Clear and Coherent  Volume:  Normal  Mood:  NA  Affect:  Appropriate  Thought Process:  Coherent  Orientation:  Full (Time, Place, and Person)  Thought Content: Logical   Suicidal Thoughts:  No  Homicidal Thoughts:  No  Memory:  WNL  Judgement:  Good  Insight:  Good  Psychomotor Activity:  Normal  Concentration:  Concentration: Good  Recall:  Good  Fund of Knowledge: Good  Language: Good  Assets:  Desire for Improvement  ADL's:  Intact  Cognition: WNL  Prognosis:  Good    DIAGNOSES:    ICD-10-CM  1. Attention deficit hyperactivity disorder (ADHD), predominantly inattentive type  F90.0 sertraline  (ZOLOFT ) 50 MG tablet    atomoxetine  (STRATTERA ) 40 MG capsule    2. Generalized anxiety disorder  F41.1 sertraline  (ZOLOFT ) 50 MG tablet    3. Recurrent major depressive disorder, in partial remission (HCC)  F33.41 sertraline  (ZOLOFT ) 50 MG tablet    4. Insomnia due to other mental disorder  F51.05 traZODone  (DESYREL ) 50 MG tablet   F99       Receiving Psychotherapy: No    RECOMMENDATIONS:    Greater than 50% of 30 min face to face time with patient was spent on counseling and coordination of care. No significant changes this visit. We discussed her recent improvements with anxiety and depression. Very happy with her medications right now.    Requesting no medication changes at this time Continue Stratterra 40 mg daily.  Continue Zoloft  50 mg daily Continue Hydroxyzine  25 mg as needed for anxiety, panic.  Will report side effects or worsening symptoms promptly. Will follow up in 6 months to reevaluate Provided emergency contact information Currently on [facility]. Having sporadic periods Reviewed PDMP      Lincoln Renshaw, NP

## 2024-01-19 ENCOUNTER — Ambulatory Visit: Admitting: Behavioral Health

## 2024-02-17 ENCOUNTER — Ambulatory Visit: Admitting: Behavioral Health

## 2024-02-17 ENCOUNTER — Encounter: Payer: Self-pay | Admitting: Behavioral Health

## 2024-02-17 DIAGNOSIS — F99 Mental disorder, not otherwise specified: Secondary | ICD-10-CM

## 2024-02-17 DIAGNOSIS — F411 Generalized anxiety disorder: Secondary | ICD-10-CM | POA: Diagnosis not present

## 2024-02-17 DIAGNOSIS — F3341 Major depressive disorder, recurrent, in partial remission: Secondary | ICD-10-CM

## 2024-02-17 DIAGNOSIS — F900 Attention-deficit hyperactivity disorder, predominantly inattentive type: Secondary | ICD-10-CM

## 2024-02-17 DIAGNOSIS — F5105 Insomnia due to other mental disorder: Secondary | ICD-10-CM | POA: Diagnosis not present

## 2024-02-17 MED ORDER — TRAZODONE HCL 50 MG PO TABS: 50.0000 mg | ORAL_TABLET | Freq: Every day | ORAL | 1 refills | Status: AC

## 2024-02-17 MED ORDER — ATOMOXETINE HCL 40 MG PO CAPS: 40.0000 mg | ORAL_CAPSULE | Freq: Every day | ORAL | 1 refills | Status: AC

## 2024-02-17 MED ORDER — SERTRALINE HCL 50 MG PO TABS: 50.0000 mg | ORAL_TABLET | Freq: Every day | ORAL | 1 refills | Status: AC

## 2024-02-17 NOTE — Progress Notes (Signed)
 "     [facility]  Patient ID: Maria Pena,  MRN: [id number]  PCP: Rolinda Millman, MD  Date of Evaluation: 02/17/2024 Time spent:30 minutes  Chief Complaint:  Chief Complaint   Anxiety; Follow-up; Medication Refill; Patient Education; Stress     HISTORY/CURRENT STATUS: HPI 42, 24 year old female patient presents to this office for follow up visit and medication management. No significant changes since last visit.  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  Says anxiety and depression have been well controlled overall.  Very happy with the Strattera . She does not want to make any medication adjustments at this time.   Reports depression a 1/10 today and anxiety at 1/10.  She is getting 7-8 hours per night . No mania, no psychosis. No SI/HI.   Past psychotropic medications: Lexapro Hydroxyzine        Individual Medical History/ Review of Systems: Changes? :No   Allergies: Colchicine, Nickel, Other, and Soy allergy (obsolete)  Current Medications: Current Medications[1] Medication Side Effects: none  Family Medical/ Social History: Changes? No  MENTAL HEALTH EXAM:  There were no vitals taken for this visit.There is no height or weight on file to calculate BMI.  General Appearance: Casual and Neat  Eye Contact:  Good  Speech:  Clear and Coherent  Volume:  Normal  Mood:  NA  Affect:  Appropriate  Thought Process:  Coherent  Orientation:  Full (Time, Place, and Person)  Thought Content: Logical   Suicidal Thoughts:  No  Homicidal Thoughts:  No  Memory:  WNL  Judgement:  Good  Insight:  Good  Psychomotor Activity:  Normal  Concentration:  Concentration: Good  Recall:  Good  Fund of Knowledge: Good  Language: Good  Assets:  Desire for Improvement  ADL's:  Intact  Cognition: WNL  Prognosis:  Good    DIAGNOSES:    ICD-10-CM   1.  Attention deficit hyperactivity disorder (ADHD), predominantly inattentive type  F90.0 atomoxetine  (STRATTERA ) 40 MG capsule    sertraline  (ZOLOFT ) 50 MG tablet    2. Generalized anxiety disorder  F41.1 sertraline  (ZOLOFT ) 50 MG tablet    3. Recurrent major depressive disorder, in partial remission  F33.41 sertraline  (ZOLOFT ) 50 MG tablet    4. Insomnia due to other mental disorder  F51.05 traZODone  (DESYREL ) 50 MG tablet   F99       Receiving Psychotherapy: No    RECOMMENDATIONS: Greater than 50% of 30 min face to face time with patient was spent on counseling and coordination of care.  We discussed her good stability since last visit.  She has some anxiety about going into her last semester of school. Very happy with her medications right now.    Requesting no medication changes at this time. We agreed to:  Continue Stratterra 40 mg daily.  Continue Zoloft  50 mg daily Continue Hydroxyzine  25 mg as needed for anxiety, panic.  Will report side effects or worsening symptoms promptly. Will follow up in 5 months to reevaluate Provided emergency contact information Currently on [facility]. Having sporadic periods Reviewed PDMP       [name], NP     [1]  Current Outpatient Medications:    atomoxetine  (STRATTERA ) 40 MG capsule, Take 1 capsule (40 mg total) by mouth daily., Disp: 90 capsule, Rfl: 1   Biotin w/ Vitamins C & E (HAIR/SKIN/NAILS PO), Take by  mouth., Disp: , Rfl:    Calcium Carb-Cholecalciferol (CALCIUM/VITAMIN D  PO), Take by mouth every morning., Disp: , Rfl:    cetirizine (ZYRTEC) 10 MG tablet, Take 10 mg by mouth daily. (Patient not taking: Reported on 08/05/2020), Disp: , Rfl:    cycloSPORINE (SANDIMMUNE) 100 MG capsule, Take 100 mg by mouth 2 (two) times daily. (Patient not taking: Reported on 08/05/2020), Disp: , Rfl:    EPINEPHrine  0.3 mg/0.3 mL IJ SOAJ injection, Inject into the muscle., Disp: , Rfl:    fluticasone  (FLONASE ) 50 MCG/ACT nasal spray, Place 2 sprays  into both nostrils daily., Disp: 16 g, Rfl: 6   Multiple Vitamin (MULTIVITAMIN WITH MINERALS) TABS tablet, Take 1 tablet by mouth daily., Disp: , Rfl:    NATAZIA 3/2-2/2-3/1 MG tablet, Take 1 tablet by mouth daily., Disp: , Rfl:    norgestimate-ethinyl estradiol (ORTHO-CYCLEN) 0.25-35 MG-MCG tablet, Take 1 tablet by mouth daily., Disp: , Rfl:    sertraline  (ZOLOFT ) 50 MG tablet, Take 1 tablet (50 mg total) by mouth daily., Disp: 90 tablet, Rfl: 1   traZODone  (DESYREL ) 50 MG tablet, Take 1 tablet (50 mg total) by mouth at bedtime., Disp: 90 tablet, Rfl: 1   Vitamin D , Ergocalciferol , (DRISDOL ) 50000 units CAPS capsule, Take 1 capsule (50,000 Units total) by mouth every 7 (seven) days. (Patient not taking: Reported on 08/05/2020), Disp: 4 capsule, Rfl: 0  "

## 2024-07-17 ENCOUNTER — Ambulatory Visit: Admitting: Behavioral Health
